# Patient Record
Sex: Female | Born: 1985 | Race: White | Hispanic: No | Marital: Married | Smoking: Never smoker
Health system: Southern US, Community
[De-identification: ages and names within clinical notes are randomized; demographics above are authoritative.]

## PROBLEM LIST (undated history)

## (undated) DIAGNOSIS — I10 Essential (primary) hypertension: Secondary | ICD-10-CM

## (undated) DIAGNOSIS — O24419 Gestational diabetes mellitus in pregnancy, unspecified control: Secondary | ICD-10-CM

## (undated) DIAGNOSIS — E079 Disorder of thyroid, unspecified: Secondary | ICD-10-CM

## (undated) DIAGNOSIS — E119 Type 2 diabetes mellitus without complications: Secondary | ICD-10-CM

## (undated) HISTORY — PX: GASTRIC BYPASS: SHX52

---

## 2006-03-01 ENCOUNTER — Ambulatory Visit (HOSPITAL_COMMUNITY): Admission: RE | Admit: 2006-03-01 | Discharge: 2006-03-01 | Payer: Self-pay | Admitting: Surgery

## 2006-03-08 ENCOUNTER — Encounter: Admission: RE | Admit: 2006-03-08 | Discharge: 2006-06-06 | Payer: Self-pay | Admitting: Surgery

## 2006-03-08 ENCOUNTER — Ambulatory Visit (HOSPITAL_COMMUNITY): Admission: RE | Admit: 2006-03-08 | Discharge: 2006-03-08 | Payer: Self-pay | Admitting: Surgery

## 2006-03-09 ENCOUNTER — Ambulatory Visit (HOSPITAL_COMMUNITY): Admission: RE | Admit: 2006-03-09 | Discharge: 2006-03-09 | Payer: Self-pay | Admitting: Surgery

## 2006-11-23 ENCOUNTER — Encounter: Admission: RE | Admit: 2006-11-23 | Discharge: 2007-02-21 | Payer: Self-pay | Admitting: Surgery

## 2006-12-06 ENCOUNTER — Ambulatory Visit (HOSPITAL_COMMUNITY): Admission: RE | Admit: 2006-12-06 | Discharge: 2006-12-07 | Payer: Self-pay | Admitting: *Deleted

## 2009-03-27 ENCOUNTER — Encounter
Admission: RE | Admit: 2009-03-27 | Discharge: 2010-04-01 | Payer: Self-pay | Source: Home / Self Care | Attending: Unknown Physician Specialty | Admitting: Unknown Physician Specialty

## 2010-03-27 ENCOUNTER — Ambulatory Visit (HOSPITAL_COMMUNITY)
Admission: RE | Admit: 2010-03-27 | Discharge: 2010-03-27 | Payer: Self-pay | Source: Home / Self Care | Attending: Unknown Physician Specialty | Admitting: Unknown Physician Specialty

## 2010-04-10 ENCOUNTER — Ambulatory Visit (HOSPITAL_COMMUNITY)
Admission: RE | Admit: 2010-04-10 | Discharge: 2010-04-10 | Payer: Self-pay | Source: Home / Self Care | Attending: Unknown Physician Specialty | Admitting: Unknown Physician Specialty

## 2010-04-12 ENCOUNTER — Other Ambulatory Visit (HOSPITAL_COMMUNITY): Payer: Self-pay | Admitting: *Deleted

## 2010-04-12 DIAGNOSIS — O10919 Unspecified pre-existing hypertension complicating pregnancy, unspecified trimester: Secondary | ICD-10-CM

## 2010-04-12 DIAGNOSIS — O24919 Unspecified diabetes mellitus in pregnancy, unspecified trimester: Secondary | ICD-10-CM

## 2010-05-22 ENCOUNTER — Other Ambulatory Visit (HOSPITAL_COMMUNITY): Payer: Self-pay | Admitting: *Deleted

## 2010-05-22 ENCOUNTER — Ambulatory Visit (HOSPITAL_COMMUNITY)
Admission: RE | Admit: 2010-05-22 | Discharge: 2010-05-22 | Disposition: A | Discharge status: Home / Self Care | Payer: BC Managed Care – PPO | Source: Ambulatory Visit | Attending: Family Medicine | Admitting: Family Medicine

## 2010-05-22 ENCOUNTER — Other Ambulatory Visit (HOSPITAL_COMMUNITY): Payer: Self-pay

## 2010-05-22 DIAGNOSIS — O10919 Unspecified pre-existing hypertension complicating pregnancy, unspecified trimester: Secondary | ICD-10-CM

## 2010-05-22 DIAGNOSIS — Z363 Encounter for antenatal screening for malformations: Secondary | ICD-10-CM | POA: Insufficient documentation

## 2010-05-22 DIAGNOSIS — Z1389 Encounter for screening for other disorder: Secondary | ICD-10-CM | POA: Insufficient documentation

## 2010-05-22 DIAGNOSIS — O358XX0 Maternal care for other (suspected) fetal abnormality and damage, not applicable or unspecified: Secondary | ICD-10-CM | POA: Insufficient documentation

## 2010-05-22 DIAGNOSIS — O24919 Unspecified diabetes mellitus in pregnancy, unspecified trimester: Secondary | ICD-10-CM

## 2010-05-22 DIAGNOSIS — O10019 Pre-existing essential hypertension complicating pregnancy, unspecified trimester: Secondary | ICD-10-CM | POA: Insufficient documentation

## 2010-06-19 ENCOUNTER — Other Ambulatory Visit (HOSPITAL_COMMUNITY): Payer: Self-pay | Admitting: Unknown Physician Specialty

## 2010-06-19 ENCOUNTER — Ambulatory Visit (HOSPITAL_COMMUNITY)
Admission: RE | Admit: 2010-06-19 | Discharge: 2010-06-19 | Disposition: A | Discharge status: Home / Self Care | Payer: BC Managed Care – PPO | Source: Ambulatory Visit | Attending: Unknown Physician Specialty | Admitting: Unknown Physician Specialty

## 2010-06-19 DIAGNOSIS — O10019 Pre-existing essential hypertension complicating pregnancy, unspecified trimester: Secondary | ICD-10-CM | POA: Insufficient documentation

## 2010-06-19 DIAGNOSIS — O24919 Unspecified diabetes mellitus in pregnancy, unspecified trimester: Secondary | ICD-10-CM

## 2010-06-19 DIAGNOSIS — O10919 Unspecified pre-existing hypertension complicating pregnancy, unspecified trimester: Secondary | ICD-10-CM

## 2010-07-17 ENCOUNTER — Ambulatory Visit (HOSPITAL_COMMUNITY)
Admission: RE | Admit: 2010-07-17 | Discharge: 2010-07-17 | Disposition: A | Discharge status: Home / Self Care | Payer: BC Managed Care – PPO | Source: Ambulatory Visit | Attending: Unknown Physician Specialty | Admitting: Unknown Physician Specialty

## 2010-07-17 ENCOUNTER — Encounter (HOSPITAL_COMMUNITY): Payer: Self-pay

## 2010-07-17 DIAGNOSIS — O24919 Unspecified diabetes mellitus in pregnancy, unspecified trimester: Secondary | ICD-10-CM | POA: Insufficient documentation

## 2010-07-17 DIAGNOSIS — O10019 Pre-existing essential hypertension complicating pregnancy, unspecified trimester: Secondary | ICD-10-CM | POA: Insufficient documentation

## 2010-08-05 NOTE — Op Note (Signed)
NAMEBRYLEIGH, OTTAWAY                ACCOUNT NO.:  [id number]   MEDICAL RECORD NO.:  [id number]          PATIENT TYPE:  AMB   LOCATION:  DAY                          FACILITY:  [facility]   PHYSICIAN:  Thornton Park. Daphine Deutscher, MD  DATE OF BIRTH:  Dec 15, 1985   DATE OF PROCEDURE:  12/06/2006  DATE OF DISCHARGE:                               OPERATIVE REPORT   CCS NUMBER:  [id number]   PREOPERATIVE DIAGNOSIS:  Morbid obesity, body mass index 47.   POSTOPERATIVE DIAGNOSIS:  Morbid obesity, body mass index 47.   PROCEDURE:  Placement of Allergan APL lap band.   SURGEON:  Thornton Park. Daphine Deutscher, MD   ASSISTANT:  Lorne Skeens. Hoxworth, MD   ANESTHESIA:  General.   DESCRIPTION OF PROCEDURE:  This 24 year old white female was taken to  room #1 on the afternoon of September 15 and given general anesthesia.  The abdomen was prepped with Techni-Care and draped sterilely.  The  abdomen was entered through the left upper quadrant with the auto-  applied medical handled OptiView-type device and then the standard ports  were placed without difficulty.  The patient had a very large liver and  had a really odd-looking caudate lobe.  Nevertheless, I was able to do a  dissection, identifying her anatomy.  She did have rather marked central  obesity.  I went ahead and passed a band passer through the upper  abdominal port and the 10/15 port was on the right side in the more  inferior position.  With the band passer in place, which went across  easily, I then inserted the APL system, then brought it around and  secured it.  Prior to doing this, I passed the tube for calibration and  then secured the band and snapped it down.  Three sutures were placed  using a Lapra-Ty after placing them with free sutures and getting good  apposition of the anterior fundus up onto the small pouch.  There was a  little bleeding with the last suture, but this stopped when the tie knot  was secured.  The tubing was brought out through the  15 port and then  this incision was then expanded and the port was connected and I had  allowed it to decompress naturally  and then when the pressure equilibrated, I put the new band port on and  then secured it with 4 sutures of 2-0 Prolene.  The wounds were  irrigated and closed 4-0 Vicryl and Dermabond.  The patient was taken to  the recovery room in satisfactory condition.      Thornton Park Daphine Deutscher, MD  Electronically Signed     MBM/MEDQ  D:  12/06/2006  T:  12/07/2006  Job:  [id number]   cc:   Donzetta Sprung  Fax: [phone]

## 2011-01-01 LAB — DIFFERENTIAL
Eosinophils Absolute: 0
Eosinophils Relative: 0
Lymphocytes Relative: 16
Lymphs Abs: 1.8
Monocytes Relative: 7

## 2011-01-01 LAB — CBC
HCT: 34 — ABNORMAL LOW
Hemoglobin: 11.6 — ABNORMAL LOW
MCV: 81.4
RBC: 4.17
WBC: 11.1 — ABNORMAL HIGH

## 2011-01-02 LAB — URINALYSIS, ROUTINE W REFLEX MICROSCOPIC
Glucose, UA: 250 — AB
Hgb urine dipstick: NEGATIVE
Ketones, ur: NEGATIVE
Protein, ur: NEGATIVE
pH: 5.5

## 2011-01-02 LAB — BASIC METABOLIC PANEL
CO2: 25
Calcium: 9.8
GFR calc Af Amer: >60
GFR calc non Af Amer: >60
Glucose, Bld: 150 — ABNORMAL HIGH
Potassium: 3.7
Sodium: 137

## 2011-01-02 LAB — URINE MICROSCOPIC-ADD ON

## 2011-01-02 LAB — HEMOGLOBIN AND HEMATOCRIT, BLOOD: HCT: 39.5

## 2013-09-11 ENCOUNTER — Other Ambulatory Visit: Payer: Self-pay

## 2013-09-29 ENCOUNTER — Encounter (HOSPITAL_COMMUNITY): Payer: Self-pay

## 2013-09-29 ENCOUNTER — Ambulatory Visit (HOSPITAL_COMMUNITY): Payer: PRIVATE HEALTH INSURANCE

## 2013-09-29 ENCOUNTER — Ambulatory Visit (HOSPITAL_COMMUNITY)
Admission: RE | Admit: 2013-09-29 | Discharge: 2013-09-29 | Disposition: A | Discharge status: Home / Self Care | Payer: PRIVATE HEALTH INSURANCE | Source: Ambulatory Visit | Attending: Nurse Practitioner | Admitting: Nurse Practitioner

## 2013-09-29 DIAGNOSIS — Z9884 Bariatric surgery status: Secondary | ICD-10-CM | POA: Insufficient documentation

## 2013-09-29 DIAGNOSIS — O10019 Pre-existing essential hypertension complicating pregnancy, unspecified trimester: Secondary | ICD-10-CM | POA: Insufficient documentation

## 2013-09-29 DIAGNOSIS — Z794 Long term (current) use of insulin: Secondary | ICD-10-CM | POA: Insufficient documentation

## 2013-09-29 DIAGNOSIS — E119 Type 2 diabetes mellitus without complications: Secondary | ICD-10-CM | POA: Insufficient documentation

## 2013-09-29 DIAGNOSIS — O24919 Unspecified diabetes mellitus in pregnancy, unspecified trimester: Secondary | ICD-10-CM | POA: Insufficient documentation

## 2013-09-29 HISTORY — DX: Essential (primary) hypertension: I10

## 2013-09-29 HISTORY — DX: Gestational diabetes mellitus in pregnancy, unspecified control: O24.419

## 2013-09-29 NOTE — Progress Notes (Signed)
MATERNAL FETAL MEDICINE CONSULT  Patient Name: Elizabeth Pennington Medical Record Number:  [id number] Date of Birth: 04-21-1985 Requesting Physician Name:  Orbie Hurst, NP Date of Service: 09/29/2013  Chief Complaint Type II diabetes in pregnancy  History of Present Illness Melondy Blanchard was seen today secondary to type II diabetes in pregnancy at the request of Orbie Hurst, NP.  The patient is a 28 y.o. G2P1,at [redacted]w[redacted]d with an EDD of 05/11/2014, by Last Menstrual Period dating method.  She has had type II diabetes mellitus since she was a sophomore in high school.  Prior to pregnancy she was receiving only metformin for glycemic control.  Since becoming pregnant, her primary OB Dr. Moishe Spice has placed her on Levemir insulin 50 units [place] qhs and Humalog insulin at a carb/insulin ration of 15 g:1 unit with each meal.  She also continues to take metformin extended release 1000 mg po daily.  Review of her glucose log reveals very poor glycemic control.  Her fastings range between 140-180.  Her one hour post-prandial values were consistently above 180 with several values over 200.  Review of Systems Pertinent items are noted in HPI.  Obstetrical History G1 LTCS at 38 weeks due to macrosomia G2  Current  Past Medical and Surgical History Ms. Kenan has type II diabetes mellitus and chronic hypertension.  In addition to her LTCS she also had laparoscopic gastric banding in 2007.  She has no other history of chronic medical diseases or prior surgeries.  Family History The patient and the father of the baby have no family history of mental retardation, birth defects, or genetic diseases.  Social History The patient does not use tobacco, alcohol, or other illicit drugs.   Assessment and Recommendations 1.  Very poorly controlled type II diabetes.  Given the severity of the hyperglycemia and that fact that Ms. Lorenzen is just about to enter the window of organogenesis, I recommended hospitalization for  aggressive increases in her insulin doses to achieve euglycemia as soon as possible to diminish the risk of birth defects.    Ms. Tarazon decline preferring to have her insulin doses increased as an outpatient.  The values reviewed at today's visit were prior to the most recent increase in the dose of Levemir and the inititation of Humalog with meals.  Thus I will not make any adjustments to her regimen at this time.  Ms. Robleto will fax [place] her glucose log next Tuesday to determine if adjustments are necessary.  We will also be happy to continue to monitor her blood sugars and make adjustments throughout pregnancy.  I reviewed the goals of therapy, fasting blood sugars less than 90 mg/dl and 2-hour postprandial less than 120 mg/dl with avoidance of hypoglycemia.  Ms. Primm should have interval serial growth ultrasounds every 4 weeks after her anatomy scan at approximately 18 weeks and twice weekly antepartum testing beginning at 32 weeks. 2.  Chronic Hypertension.  Although Ms. Sapp reports her blood pressure has been well-controlled since she stopped her BP medication 3 months ago when trying to conceive, her chronic hypertension still poses a risk to her pregnancy including an increased risk of fetal growth restriction, preterm birth, and superimposed preeclampsia.  As she also has DM she does not require any further ultrasounds or fetal monitoring other than that listed above. 3.  History of gastric banding.  I do not suspect this will be a problem as it did not cause any issues in her last pregnancy.  If the  balloon is not already deflated, I would arrange for it to be so.  I spent 30 minutes with Ms. Berens today of which 50% was face-to-face counseling.  [facility].  Please do not hesitate to contact us with questions.   Rema FendtNITSCHE,Kyndel Egger, MD

## 2013-10-03 ENCOUNTER — Other Ambulatory Visit: Payer: Self-pay

## 2013-10-03 ENCOUNTER — Telehealth (HOSPITAL_COMMUNITY): Payer: Self-pay | Admitting: *Deleted

## 2013-10-03 NOTE — Telephone Encounter (Signed)
[facility] faxed in blood sugar log today, see scanned document.  Rev'd sugars with Dr. Vincenza HewsQuinn. Notified pt to increase Levemir to 55units at night and Humulog 6units at breakfast, 9units at lunch and dinner.  Pt instructed to take humulog right before meals.  Pt voices understanding.

## 2013-11-03 ENCOUNTER — Other Ambulatory Visit: Payer: Self-pay

## 2013-12-12 ENCOUNTER — Encounter (HOSPITAL_COMMUNITY): Payer: Self-pay | Admitting: Obstetrics and Gynecology

## 2013-12-14 ENCOUNTER — Other Ambulatory Visit (HOSPITAL_COMMUNITY): Payer: Self-pay | Admitting: Obstetrics and Gynecology

## 2013-12-14 DIAGNOSIS — Z3689 Encounter for other specified antenatal screening: Secondary | ICD-10-CM

## 2013-12-14 DIAGNOSIS — O24912 Unspecified diabetes mellitus in pregnancy, second trimester: Secondary | ICD-10-CM

## 2013-12-20 ENCOUNTER — Encounter (HOSPITAL_COMMUNITY): Payer: Self-pay

## 2013-12-20 ENCOUNTER — Ambulatory Visit (HOSPITAL_COMMUNITY)
Admission: RE | Admit: 2013-12-20 | Discharge: 2013-12-20 | Disposition: A | Discharge status: Home / Self Care | Payer: PRIVATE HEALTH INSURANCE | Source: Ambulatory Visit | Attending: Obstetrics and Gynecology | Admitting: Obstetrics and Gynecology

## 2013-12-20 ENCOUNTER — Other Ambulatory Visit (HOSPITAL_COMMUNITY): Payer: PRIVATE HEALTH INSURANCE

## 2013-12-20 VITALS — BP 125/64 | HR 78 | Wt 260.8 lb

## 2013-12-20 DIAGNOSIS — O24912 Unspecified diabetes mellitus in pregnancy, second trimester: Secondary | ICD-10-CM

## 2013-12-20 DIAGNOSIS — E119 Type 2 diabetes mellitus without complications: Secondary | ICD-10-CM | POA: Insufficient documentation

## 2013-12-20 DIAGNOSIS — Z3689 Encounter for other specified antenatal screening: Secondary | ICD-10-CM | POA: Diagnosis not present

## 2013-12-20 DIAGNOSIS — O24919 Unspecified diabetes mellitus in pregnancy, unspecified trimester: Secondary | ICD-10-CM | POA: Insufficient documentation

## 2014-01-11 ENCOUNTER — Encounter: Payer: Self-pay | Admitting: Obstetrics and Gynecology

## 2014-01-17 ENCOUNTER — Ambulatory Visit (HOSPITAL_COMMUNITY)
Admission: RE | Admit: 2014-01-17 | Discharge: 2014-01-17 | Disposition: A | Discharge status: Home / Self Care | Payer: PRIVATE HEALTH INSURANCE | Source: Ambulatory Visit | Attending: Obstetrics and Gynecology | Admitting: Obstetrics and Gynecology

## 2014-01-17 ENCOUNTER — Ambulatory Visit (HOSPITAL_COMMUNITY): Payer: PRIVATE HEALTH INSURANCE

## 2014-01-17 ENCOUNTER — Other Ambulatory Visit (HOSPITAL_COMMUNITY): Payer: Self-pay | Admitting: Maternal and Fetal Medicine

## 2014-01-17 ENCOUNTER — Encounter (HOSPITAL_COMMUNITY): Payer: Self-pay

## 2014-01-17 VITALS — BP 121/58 | HR 83 | Wt 262.2 lb

## 2014-01-17 DIAGNOSIS — O24112 Pre-existing diabetes mellitus, type 2, in pregnancy, second trimester: Secondary | ICD-10-CM | POA: Insufficient documentation

## 2014-01-17 DIAGNOSIS — Z36 Encounter for antenatal screening of mother: Secondary | ICD-10-CM | POA: Insufficient documentation

## 2014-01-17 DIAGNOSIS — O34219 Maternal care for unspecified type scar from previous cesarean delivery: Secondary | ICD-10-CM

## 2014-01-17 DIAGNOSIS — Z9884 Bariatric surgery status: Secondary | ICD-10-CM

## 2014-01-17 DIAGNOSIS — Z3A23 23 weeks gestation of pregnancy: Secondary | ICD-10-CM | POA: Diagnosis not present

## 2014-01-17 DIAGNOSIS — O162 Unspecified maternal hypertension, second trimester: Secondary | ICD-10-CM

## 2014-01-17 DIAGNOSIS — Z3689 Encounter for other specified antenatal screening: Secondary | ICD-10-CM

## 2014-01-17 DIAGNOSIS — O24912 Unspecified diabetes mellitus in pregnancy, second trimester: Secondary | ICD-10-CM

## 2014-01-17 DIAGNOSIS — E119 Type 2 diabetes mellitus without complications: Secondary | ICD-10-CM | POA: Insufficient documentation

## 2014-01-17 DIAGNOSIS — O10012 Pre-existing essential hypertension complicating pregnancy, second trimester: Secondary | ICD-10-CM

## 2014-01-17 DIAGNOSIS — O24913 Unspecified diabetes mellitus in pregnancy, third trimester: Secondary | ICD-10-CM

## 2014-01-22 ENCOUNTER — Encounter (HOSPITAL_COMMUNITY): Payer: Self-pay

## 2014-02-14 ENCOUNTER — Ambulatory Visit (HOSPITAL_COMMUNITY): Payer: PRIVATE HEALTH INSURANCE

## 2014-02-14 ENCOUNTER — Encounter (HOSPITAL_COMMUNITY): Payer: Self-pay

## 2014-02-14 ENCOUNTER — Ambulatory Visit (HOSPITAL_COMMUNITY)
Admission: RE | Admit: 2014-02-14 | Discharge: 2014-02-14 | Disposition: A | Discharge status: Home / Self Care | Payer: PRIVATE HEALTH INSURANCE | Source: Ambulatory Visit | Attending: Obstetrics and Gynecology | Admitting: Obstetrics and Gynecology

## 2014-02-14 DIAGNOSIS — O24112 Pre-existing diabetes mellitus, type 2, in pregnancy, second trimester: Secondary | ICD-10-CM | POA: Insufficient documentation

## 2014-02-14 DIAGNOSIS — O352XX0 Maternal care for (suspected) hereditary disease in fetus, not applicable or unspecified: Secondary | ICD-10-CM | POA: Insufficient documentation

## 2014-02-14 DIAGNOSIS — Z9884 Bariatric surgery status: Secondary | ICD-10-CM

## 2014-02-14 DIAGNOSIS — O337 Maternal care for disproportion due to other fetal deformities: Secondary | ICD-10-CM | POA: Diagnosis not present

## 2014-02-14 DIAGNOSIS — O10012 Pre-existing essential hypertension complicating pregnancy, second trimester: Secondary | ICD-10-CM | POA: Insufficient documentation

## 2014-02-14 DIAGNOSIS — Z3A27 27 weeks gestation of pregnancy: Secondary | ICD-10-CM | POA: Diagnosis not present

## 2014-02-14 DIAGNOSIS — O24912 Unspecified diabetes mellitus in pregnancy, second trimester: Secondary | ICD-10-CM

## 2014-02-14 DIAGNOSIS — O99212 Obesity complicating pregnancy, second trimester: Secondary | ICD-10-CM | POA: Diagnosis present

## 2014-02-14 DIAGNOSIS — O3421 Maternal care for scar from previous cesarean delivery: Secondary | ICD-10-CM | POA: Diagnosis not present

## 2014-02-14 DIAGNOSIS — Z36 Encounter for antenatal screening of mother: Secondary | ICD-10-CM | POA: Diagnosis not present

## 2014-02-14 DIAGNOSIS — O24913 Unspecified diabetes mellitus in pregnancy, third trimester: Secondary | ICD-10-CM

## 2014-02-14 DIAGNOSIS — O34219 Maternal care for unspecified type scar from previous cesarean delivery: Secondary | ICD-10-CM

## 2014-02-14 DIAGNOSIS — Z794 Long term (current) use of insulin: Secondary | ICD-10-CM | POA: Diagnosis not present

## 2014-02-14 DIAGNOSIS — Z3689 Encounter for other specified antenatal screening: Secondary | ICD-10-CM

## 2014-02-14 DIAGNOSIS — O24919 Unspecified diabetes mellitus in pregnancy, unspecified trimester: Secondary | ICD-10-CM | POA: Insufficient documentation

## 2014-03-14 ENCOUNTER — Encounter (HOSPITAL_COMMUNITY): Payer: Self-pay

## 2014-03-14 ENCOUNTER — Ambulatory Visit (HOSPITAL_COMMUNITY)
Admission: RE | Admit: 2014-03-14 | Discharge: 2014-03-14 | Disposition: A | Discharge status: Home / Self Care | Payer: PRIVATE HEALTH INSURANCE | Source: Ambulatory Visit | Attending: Obstetrics and Gynecology | Admitting: Obstetrics and Gynecology

## 2014-03-14 ENCOUNTER — Other Ambulatory Visit (HOSPITAL_COMMUNITY): Payer: Self-pay | Admitting: Maternal and Fetal Medicine

## 2014-03-14 DIAGNOSIS — O99213 Obesity complicating pregnancy, third trimester: Secondary | ICD-10-CM | POA: Insufficient documentation

## 2014-03-14 DIAGNOSIS — Z3A27 27 weeks gestation of pregnancy: Secondary | ICD-10-CM | POA: Diagnosis not present

## 2014-03-14 DIAGNOSIS — Z3A31 31 weeks gestation of pregnancy: Secondary | ICD-10-CM | POA: Insufficient documentation

## 2014-03-14 DIAGNOSIS — O24913 Unspecified diabetes mellitus in pregnancy, third trimester: Secondary | ICD-10-CM

## 2014-03-14 DIAGNOSIS — O10012 Pre-existing essential hypertension complicating pregnancy, second trimester: Secondary | ICD-10-CM | POA: Insufficient documentation

## 2014-04-11 ENCOUNTER — Ambulatory Visit (HOSPITAL_COMMUNITY): Payer: PRIVATE HEALTH INSURANCE

## 2014-04-12 ENCOUNTER — Encounter (HOSPITAL_COMMUNITY): Payer: Self-pay

## 2014-04-12 ENCOUNTER — Ambulatory Visit (HOSPITAL_COMMUNITY)
Admission: RE | Admit: 2014-04-12 | Discharge: 2014-04-12 | Disposition: A | Discharge status: Home / Self Care | Payer: PRIVATE HEALTH INSURANCE | Source: Ambulatory Visit | Attending: Obstetrics and Gynecology | Admitting: Obstetrics and Gynecology

## 2014-04-12 DIAGNOSIS — O99210 Obesity complicating pregnancy, unspecified trimester: Secondary | ICD-10-CM | POA: Insufficient documentation

## 2014-04-12 DIAGNOSIS — O24113 Pre-existing diabetes mellitus, type 2, in pregnancy, third trimester: Secondary | ICD-10-CM | POA: Diagnosis not present

## 2014-04-12 DIAGNOSIS — Z3A35 35 weeks gestation of pregnancy: Secondary | ICD-10-CM | POA: Diagnosis not present

## 2014-04-12 DIAGNOSIS — O24919 Unspecified diabetes mellitus in pregnancy, unspecified trimester: Secondary | ICD-10-CM | POA: Insufficient documentation

## 2014-04-12 DIAGNOSIS — O10013 Pre-existing essential hypertension complicating pregnancy, third trimester: Secondary | ICD-10-CM | POA: Diagnosis not present

## 2014-04-12 DIAGNOSIS — O34219 Maternal care for unspecified type scar from previous cesarean delivery: Secondary | ICD-10-CM | POA: Insufficient documentation

## 2014-04-12 DIAGNOSIS — O24913 Unspecified diabetes mellitus in pregnancy, third trimester: Secondary | ICD-10-CM

## 2014-04-12 DIAGNOSIS — E119 Type 2 diabetes mellitus without complications: Secondary | ICD-10-CM | POA: Insufficient documentation

## 2014-04-12 DIAGNOSIS — O99843 Bariatric surgery status complicating pregnancy, third trimester: Secondary | ICD-10-CM | POA: Diagnosis not present

## 2014-04-12 DIAGNOSIS — O3421 Maternal care for scar from previous cesarean delivery: Secondary | ICD-10-CM | POA: Diagnosis not present

## 2014-08-04 ENCOUNTER — Encounter (HOSPITAL_COMMUNITY): Payer: Self-pay | Admitting: *Deleted

## 2016-09-14 IMAGING — US US OB FOLLOW-UP
1 series · 12 of 28 positions shown · non-contrast
Comparison: none

[Series 1: us ob follow-up · 0.23mm/px · 33 acquisitions, 12 frames shown]
[im 2/33]
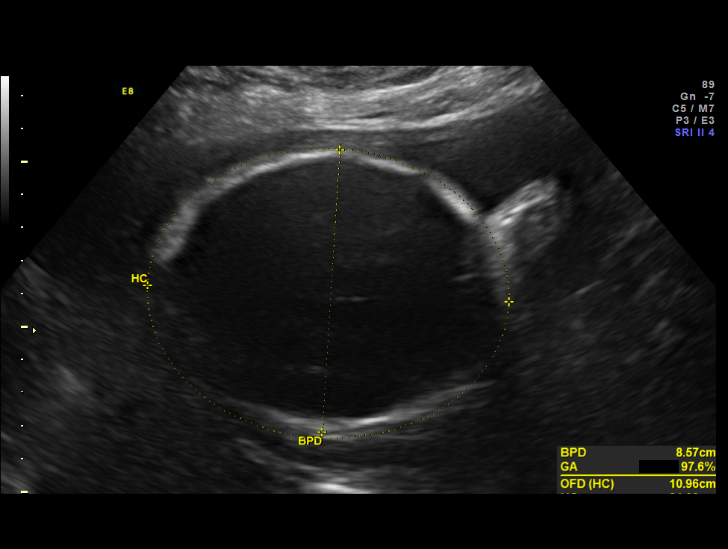
[im 4/33]
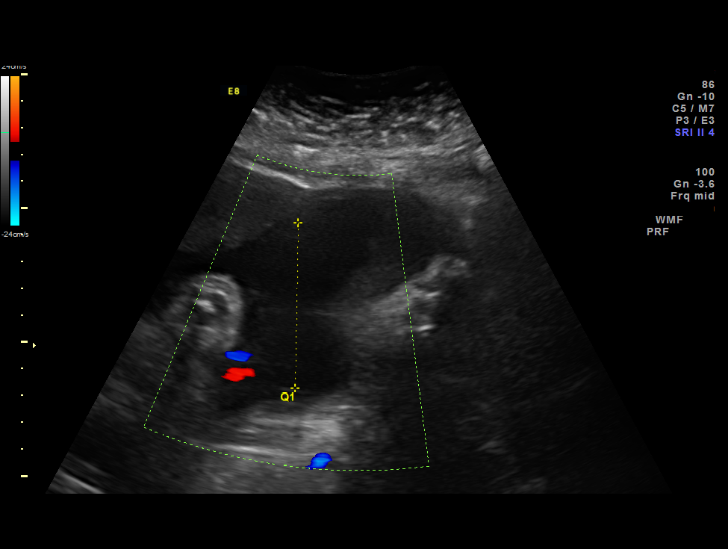
[im 6/33]
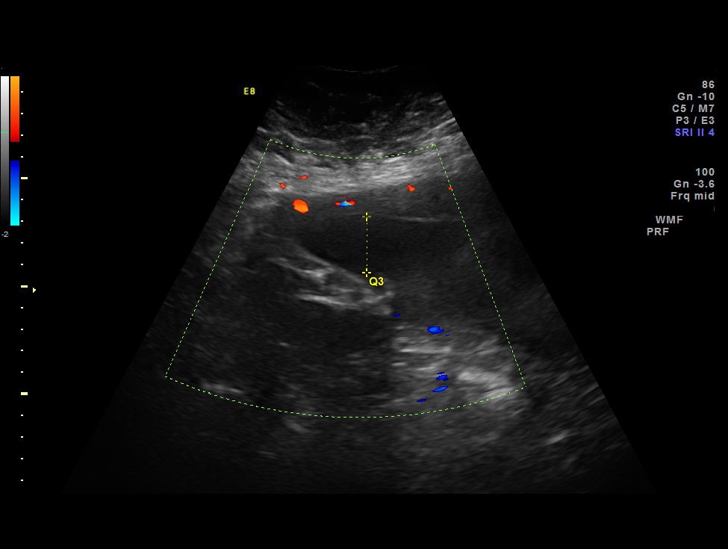
[im 10/33]
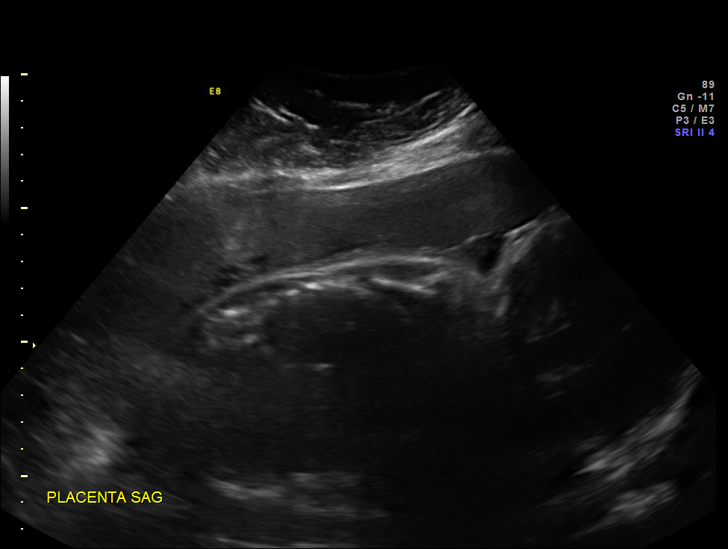
[im 12/33]
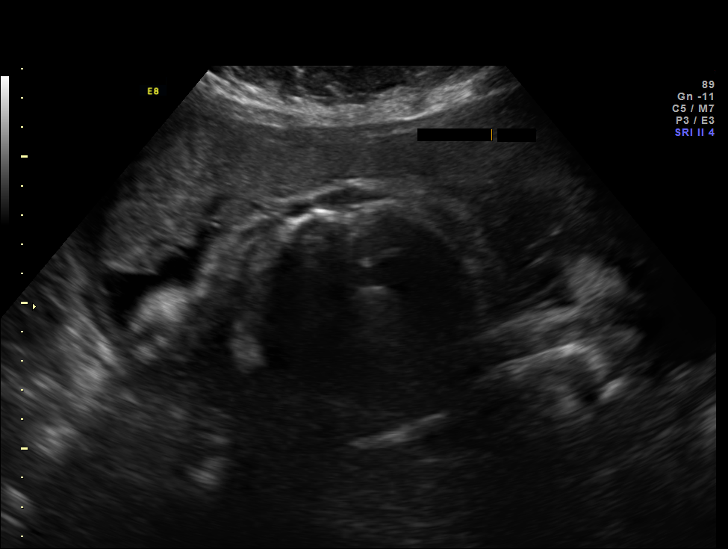
[im 15/33]
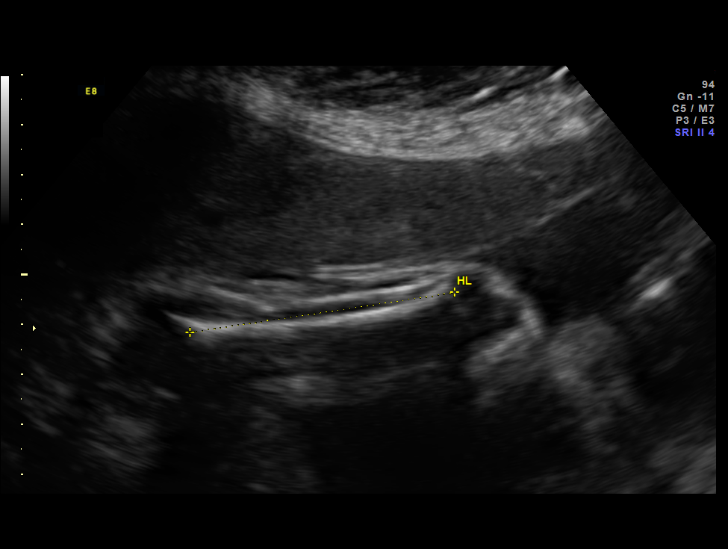
[im 18/33]
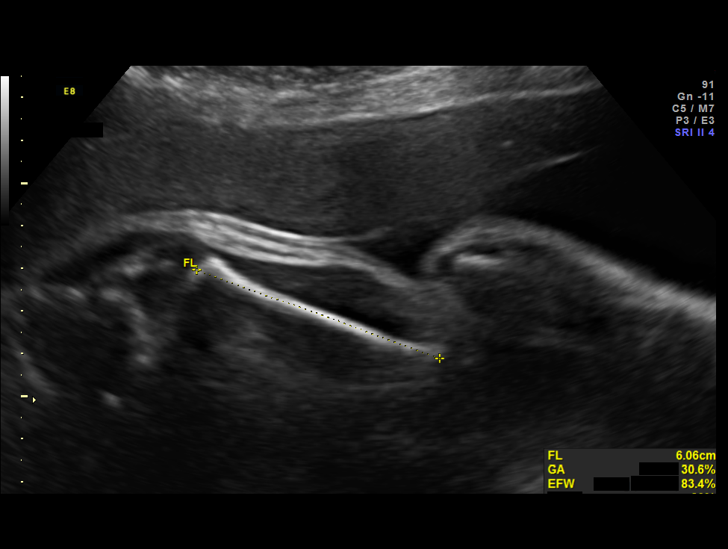
[im 21/33]
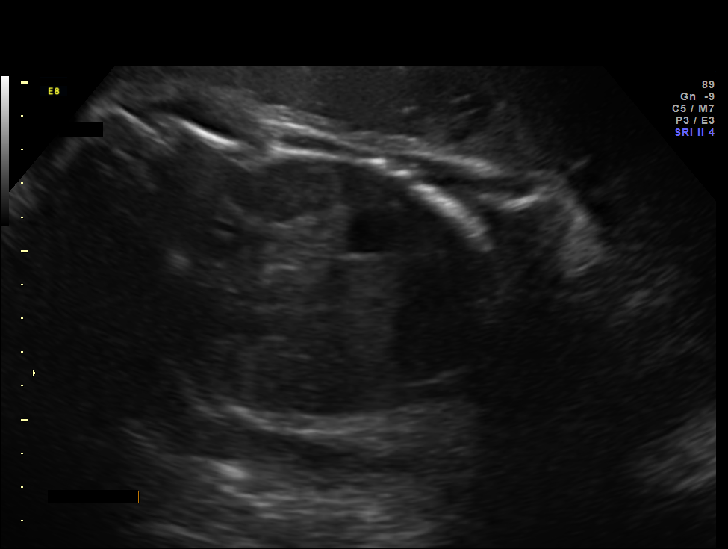
[im 23/33]
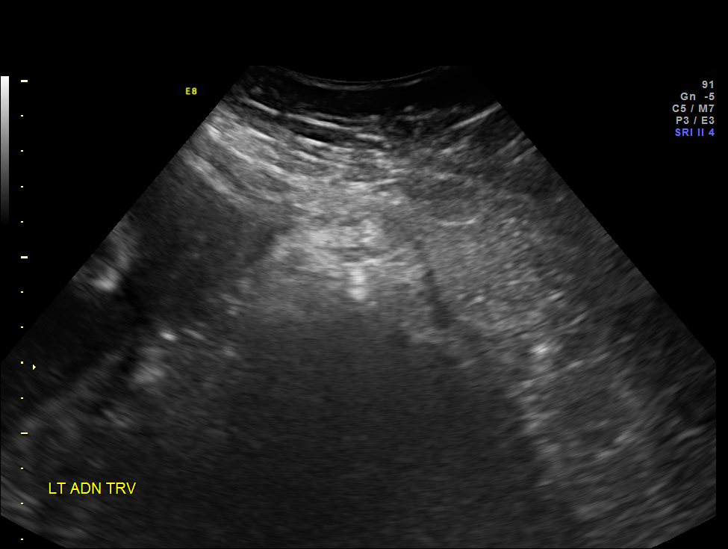
[im 27/33]
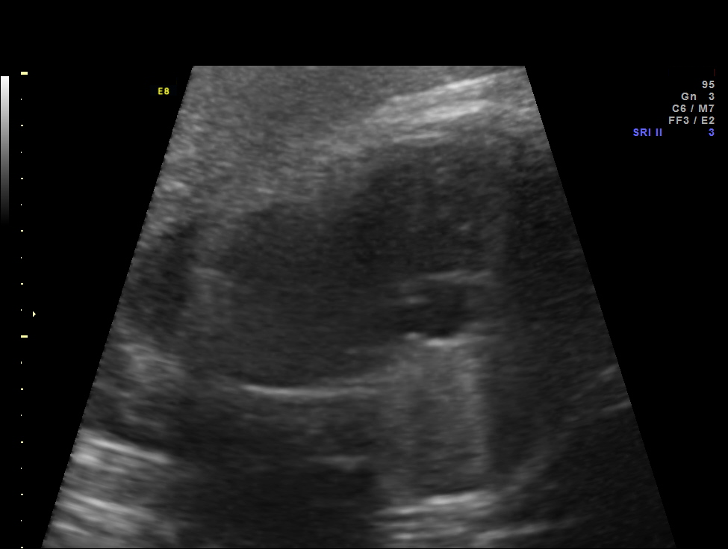
[im 29/33]
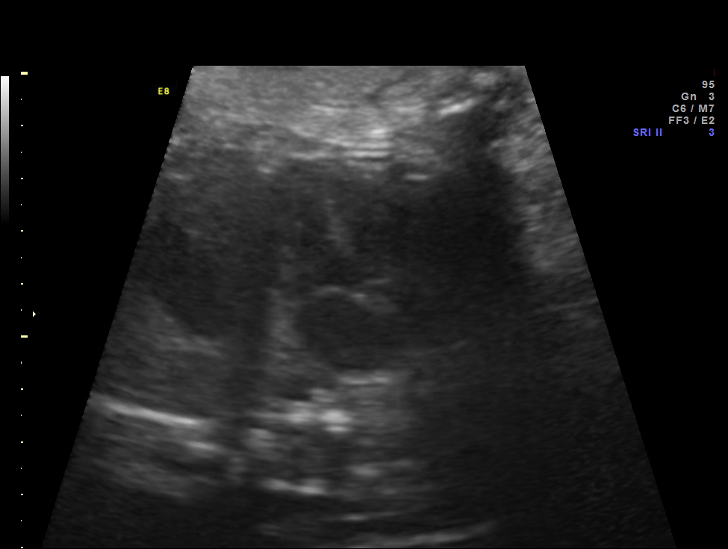
[im 31/33]
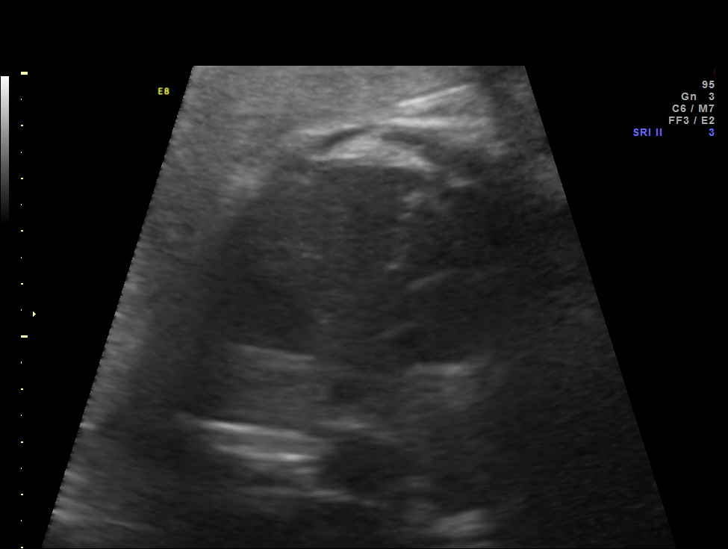

[12 of 28 positions shown; findings below may reference images not displayed]

OBSTETRICS REPORT
                      (Signed Final 03/14/2014 [DATE])

Service(s) Provided

 US OB FOLLOW UP                                       76816.1
Indications

 H/O Gastric bypass
 History of cesarean delivery, currently pregnant
 Poor obstetric history: Previous macrosomia

 31 weeks gestation of pregnancy
 Obesity complicating pregnancy, third trimester
 (273 lb)
 Pre-existing diabetes, type 2, in pregnancy, third
 trimester (Levemir, Humalog and Metformin)
 Normal fetal ECHO
 Pre-existing essential hypertension complicating
 pregnancy, third trimester
Fetal Evaluation

 Num Of Fetuses:    1
 Fetal Heart Rate:  138                          bpm
 Cardiac Activity:  Observed
 Presentation:      Cephalic
 Placenta:          Anterior, above cervical os
 P. Cord            Previously Visualized
 Insertion:

 Amniotic Fluid
 AFI FV:      Subjectively within normal limits
 AFI Sum:     16.13   cm       58  %Tile     Larg Pckt:    6.19  cm
 RUQ:   6.19    cm   RLQ:    2.5    cm    LUQ:   4.85    cm   LLQ:    2.59   cm
Biometry

 BPD:     85.2  mm     G. Age:  34w 2d                CI:         79.5   70 - 86
 OFD:    107.2  mm                                    FL/HC:      20.1   19.1 -

 HC:     307.6  mm     G. Age:  34w 2d       81  %    HC/AC:      1.00   0.96 -

 AC:     308.2  mm     G. Age:  34w 5d     > 97  %    FL/BPD:     72.5   71 - 87
 FL:      61.8  mm     G. Age:  32w 0d       46  %    FL/AC:      20.1   20 - 24
 HUM:     54.5  mm     G. Age:  31w 5d       51  %

 Est. FW:    2155  gm      5 lb 1 oz     85  %
Gestational Age

 LMP:           31w 5d        Date:  08/04/13                 EDD:   05/11/14
 U/S Today:     33w 6d                                        EDD:   04/26/14
 Best:          31w 5d     Det. By:  LMP  (08/04/13)          EDD:   05/11/14
Anatomy

 Cranium:          Appears normal         Aortic Arch:      Not well visualized
 Fetal Cavum:      Previously seen        Ductal Arch:      Not well visualized
 Ventricles:       Previously seen        Diaphragm:        Appears normal
 Choroid Plexus:   Previously seen        Stomach:          Appears normal, left
                                                            sided
 Cerebellum:       Previously seen        Abdomen:          Appears normal
 Posterior Fossa:  Previously seen        Abdominal Wall:   Previously seen
 Nuchal Fold:      Previously seen        Cord Vessels:     Previously seen
 Face:             Orbits and profile     Kidneys:          Appear normal
                   previously seen
 Lips:             Previously seen        Bladder:          Appears normal
 Heart:            Appears normal         Spine:            Previously seen
                   (4CH, axis, and
                   situs)
 RVOT:             Not well visualized    Lower             Previously seen
                                          Extremities:
 LVOT:             Appears normal         Upper             Previously seen
                                          Extremities:

 Other:  Female gender previously seen. Technically difficult due to maternal
         habitus and fetal position. Markedly suboptimal exam.
Cervix Uterus Adnexa

 Cervix:       Not visualized (advanced GA >09wks)

 Adnexa:     No abnormality visualized.
Impression

 SIUP at 31+5 weeks
 Normal interval anatomy; anatomic survey complete; limited
 views of RVOT and arches
 Normal amniotic fluid volume
 EFW at the 85th %tile; AC > 97th %tile
Recommendations

 Agree with testing starting next week
 Follow-up ultrasound for growth in 4 weeks

 questions or concerns.

## 2017-04-21 DIAGNOSIS — E119 Type 2 diabetes mellitus without complications: Secondary | ICD-10-CM | POA: Diagnosis not present

## 2017-04-21 DIAGNOSIS — E782 Mixed hyperlipidemia: Secondary | ICD-10-CM | POA: Diagnosis not present

## 2017-04-21 DIAGNOSIS — J020 Streptococcal pharyngitis: Secondary | ICD-10-CM | POA: Diagnosis not present

## 2017-04-21 DIAGNOSIS — I10 Essential (primary) hypertension: Secondary | ICD-10-CM | POA: Diagnosis not present

## 2017-09-28 DIAGNOSIS — E119 Type 2 diabetes mellitus without complications: Secondary | ICD-10-CM | POA: Diagnosis not present

## 2017-09-28 DIAGNOSIS — I10 Essential (primary) hypertension: Secondary | ICD-10-CM | POA: Diagnosis not present

## 2017-09-28 DIAGNOSIS — F411 Generalized anxiety disorder: Secondary | ICD-10-CM | POA: Diagnosis not present

## 2017-09-28 DIAGNOSIS — Z6841 Body Mass Index (BMI) 40.0 and over, adult: Secondary | ICD-10-CM | POA: Diagnosis not present

## 2017-09-28 DIAGNOSIS — E782 Mixed hyperlipidemia: Secondary | ICD-10-CM | POA: Diagnosis not present

## 2017-12-24 DIAGNOSIS — E782 Mixed hyperlipidemia: Secondary | ICD-10-CM | POA: Diagnosis not present

## 2017-12-24 DIAGNOSIS — E1169 Type 2 diabetes mellitus with other specified complication: Secondary | ICD-10-CM | POA: Diagnosis not present

## 2017-12-24 DIAGNOSIS — E1165 Type 2 diabetes mellitus with hyperglycemia: Secondary | ICD-10-CM | POA: Diagnosis not present

## 2017-12-24 DIAGNOSIS — I10 Essential (primary) hypertension: Secondary | ICD-10-CM | POA: Diagnosis not present

## 2017-12-24 DIAGNOSIS — E7800 Pure hypercholesterolemia, unspecified: Secondary | ICD-10-CM | POA: Diagnosis not present

## 2017-12-28 DIAGNOSIS — F411 Generalized anxiety disorder: Secondary | ICD-10-CM | POA: Diagnosis not present

## 2017-12-28 DIAGNOSIS — I10 Essential (primary) hypertension: Secondary | ICD-10-CM | POA: Diagnosis not present

## 2017-12-28 DIAGNOSIS — E119 Type 2 diabetes mellitus without complications: Secondary | ICD-10-CM | POA: Diagnosis not present

## 2017-12-28 DIAGNOSIS — E782 Mixed hyperlipidemia: Secondary | ICD-10-CM | POA: Diagnosis not present

## 2018-01-05 DIAGNOSIS — Z6838 Body mass index (BMI) 38.0-38.9, adult: Secondary | ICD-10-CM | POA: Diagnosis not present

## 2018-01-05 DIAGNOSIS — B349 Viral infection, unspecified: Secondary | ICD-10-CM | POA: Diagnosis not present

## 2018-01-05 DIAGNOSIS — R509 Fever, unspecified: Secondary | ICD-10-CM | POA: Diagnosis not present

## 2018-01-17 DIAGNOSIS — Z01419 Encounter for gynecological examination (general) (routine) without abnormal findings: Secondary | ICD-10-CM | POA: Diagnosis not present

## 2018-01-17 DIAGNOSIS — Z975 Presence of (intrauterine) contraceptive device: Secondary | ICD-10-CM | POA: Diagnosis not present

## 2018-01-17 DIAGNOSIS — Z6841 Body Mass Index (BMI) 40.0 and over, adult: Secondary | ICD-10-CM | POA: Diagnosis not present

## 2018-07-05 DIAGNOSIS — E782 Mixed hyperlipidemia: Secondary | ICD-10-CM | POA: Diagnosis not present

## 2018-07-05 DIAGNOSIS — I10 Essential (primary) hypertension: Secondary | ICD-10-CM | POA: Diagnosis not present

## 2018-07-05 DIAGNOSIS — E1165 Type 2 diabetes mellitus with hyperglycemia: Secondary | ICD-10-CM | POA: Diagnosis not present

## 2018-07-12 DIAGNOSIS — E119 Type 2 diabetes mellitus without complications: Secondary | ICD-10-CM | POA: Diagnosis not present

## 2018-07-12 DIAGNOSIS — I10 Essential (primary) hypertension: Secondary | ICD-10-CM | POA: Diagnosis not present

## 2018-07-12 DIAGNOSIS — E782 Mixed hyperlipidemia: Secondary | ICD-10-CM | POA: Diagnosis not present

## 2018-07-20 DIAGNOSIS — Z20828 Contact with and (suspected) exposure to other viral communicable diseases: Secondary | ICD-10-CM | POA: Diagnosis not present

## 2018-07-29 DIAGNOSIS — Z6838 Body mass index (BMI) 38.0-38.9, adult: Secondary | ICD-10-CM | POA: Diagnosis not present

## 2018-07-29 DIAGNOSIS — F419 Anxiety disorder, unspecified: Secondary | ICD-10-CM | POA: Diagnosis not present

## 2018-08-12 DIAGNOSIS — F419 Anxiety disorder, unspecified: Secondary | ICD-10-CM | POA: Diagnosis not present

## 2018-08-12 DIAGNOSIS — Z6838 Body mass index (BMI) 38.0-38.9, adult: Secondary | ICD-10-CM | POA: Diagnosis not present

## 2018-11-16 DIAGNOSIS — L03031 Cellulitis of right toe: Secondary | ICD-10-CM | POA: Diagnosis not present

## 2018-11-16 DIAGNOSIS — Z6838 Body mass index (BMI) 38.0-38.9, adult: Secondary | ICD-10-CM | POA: Diagnosis not present

## 2019-01-06 DIAGNOSIS — E782 Mixed hyperlipidemia: Secondary | ICD-10-CM | POA: Diagnosis not present

## 2019-01-06 DIAGNOSIS — I10 Essential (primary) hypertension: Secondary | ICD-10-CM | POA: Diagnosis not present

## 2019-01-06 DIAGNOSIS — Z6838 Body mass index (BMI) 38.0-38.9, adult: Secondary | ICD-10-CM | POA: Diagnosis not present

## 2019-01-06 DIAGNOSIS — E119 Type 2 diabetes mellitus without complications: Secondary | ICD-10-CM | POA: Diagnosis not present

## 2019-01-06 DIAGNOSIS — E039 Hypothyroidism, unspecified: Secondary | ICD-10-CM | POA: Diagnosis not present

## 2019-01-12 LAB — TSH: TSH: 0.35 — AB (ref 0.41–5.90)

## 2019-02-10 DIAGNOSIS — H52223 Regular astigmatism, bilateral: Secondary | ICD-10-CM | POA: Diagnosis not present

## 2019-03-02 DIAGNOSIS — E0590 Thyrotoxicosis, unspecified without thyrotoxic crisis or storm: Secondary | ICD-10-CM | POA: Diagnosis not present

## 2019-03-03 LAB — TSH: TSH: 0.24 — AB (ref 0.41–5.90)

## 2019-03-10 DIAGNOSIS — E0590 Thyrotoxicosis, unspecified without thyrotoxic crisis or storm: Secondary | ICD-10-CM | POA: Diagnosis not present

## 2019-04-04 DIAGNOSIS — E7800 Pure hypercholesterolemia, unspecified: Secondary | ICD-10-CM | POA: Diagnosis not present

## 2019-04-04 DIAGNOSIS — E1165 Type 2 diabetes mellitus with hyperglycemia: Secondary | ICD-10-CM | POA: Diagnosis not present

## 2019-04-04 DIAGNOSIS — E1169 Type 2 diabetes mellitus with other specified complication: Secondary | ICD-10-CM | POA: Diagnosis not present

## 2019-04-04 DIAGNOSIS — E782 Mixed hyperlipidemia: Secondary | ICD-10-CM | POA: Diagnosis not present

## 2019-06-16 DIAGNOSIS — Z23 Encounter for immunization: Secondary | ICD-10-CM | POA: Diagnosis not present

## 2019-06-19 DIAGNOSIS — Z01419 Encounter for gynecological examination (general) (routine) without abnormal findings: Secondary | ICD-10-CM | POA: Diagnosis not present

## 2019-06-19 DIAGNOSIS — Z975 Presence of (intrauterine) contraceptive device: Secondary | ICD-10-CM | POA: Diagnosis not present

## 2019-06-19 DIAGNOSIS — I10 Essential (primary) hypertension: Secondary | ICD-10-CM | POA: Diagnosis not present

## 2019-06-19 DIAGNOSIS — Z9884 Bariatric surgery status: Secondary | ICD-10-CM | POA: Diagnosis not present

## 2019-06-21 DIAGNOSIS — Z30430 Encounter for insertion of intrauterine contraceptive device: Secondary | ICD-10-CM | POA: Diagnosis not present

## 2019-07-05 DIAGNOSIS — M79675 Pain in left toe(s): Secondary | ICD-10-CM | POA: Diagnosis not present

## 2019-07-05 DIAGNOSIS — S9030XA Contusion of unspecified foot, initial encounter: Secondary | ICD-10-CM | POA: Diagnosis not present

## 2019-07-05 DIAGNOSIS — M79672 Pain in left foot: Secondary | ICD-10-CM | POA: Diagnosis not present

## 2019-07-19 DIAGNOSIS — Z30433 Encounter for removal and reinsertion of intrauterine contraceptive device: Secondary | ICD-10-CM | POA: Diagnosis not present

## 2019-08-23 DIAGNOSIS — S92335A Nondisplaced fracture of third metatarsal bone, left foot, initial encounter for closed fracture: Secondary | ICD-10-CM | POA: Diagnosis not present

## 2019-08-23 DIAGNOSIS — M79672 Pain in left foot: Secondary | ICD-10-CM | POA: Diagnosis not present

## 2019-08-23 DIAGNOSIS — M79675 Pain in left toe(s): Secondary | ICD-10-CM | POA: Diagnosis not present

## 2019-08-25 DIAGNOSIS — E0590 Thyrotoxicosis, unspecified without thyrotoxic crisis or storm: Secondary | ICD-10-CM | POA: Diagnosis not present

## 2019-08-25 DIAGNOSIS — E782 Mixed hyperlipidemia: Secondary | ICD-10-CM | POA: Diagnosis not present

## 2019-08-25 DIAGNOSIS — I10 Essential (primary) hypertension: Secondary | ICD-10-CM | POA: Diagnosis not present

## 2019-08-25 DIAGNOSIS — E1165 Type 2 diabetes mellitus with hyperglycemia: Secondary | ICD-10-CM | POA: Diagnosis not present

## 2019-08-26 LAB — TSH: TSH: 0.44 (ref 0.41–5.90)

## 2019-10-16 DIAGNOSIS — R600 Localized edema: Secondary | ICD-10-CM | POA: Diagnosis not present

## 2019-10-16 DIAGNOSIS — M79604 Pain in right leg: Secondary | ICD-10-CM | POA: Diagnosis not present

## 2019-10-16 DIAGNOSIS — M79661 Pain in right lower leg: Secondary | ICD-10-CM | POA: Diagnosis not present

## 2019-12-22 DIAGNOSIS — E782 Mixed hyperlipidemia: Secondary | ICD-10-CM | POA: Diagnosis not present

## 2019-12-22 DIAGNOSIS — E1165 Type 2 diabetes mellitus with hyperglycemia: Secondary | ICD-10-CM | POA: Diagnosis not present

## 2019-12-22 DIAGNOSIS — I10 Essential (primary) hypertension: Secondary | ICD-10-CM | POA: Diagnosis not present

## 2019-12-22 DIAGNOSIS — E0590 Thyrotoxicosis, unspecified without thyrotoxic crisis or storm: Secondary | ICD-10-CM | POA: Diagnosis not present

## 2019-12-22 DIAGNOSIS — E7800 Pure hypercholesterolemia, unspecified: Secondary | ICD-10-CM | POA: Diagnosis not present

## 2019-12-23 LAB — TSH: TSH: 0.32 — AB (ref 0.41–5.90)

## 2019-12-27 DIAGNOSIS — E782 Mixed hyperlipidemia: Secondary | ICD-10-CM | POA: Diagnosis not present

## 2019-12-27 DIAGNOSIS — Z6838 Body mass index (BMI) 38.0-38.9, adult: Secondary | ICD-10-CM | POA: Diagnosis not present

## 2019-12-27 DIAGNOSIS — I10 Essential (primary) hypertension: Secondary | ICD-10-CM | POA: Diagnosis not present

## 2019-12-27 DIAGNOSIS — E119 Type 2 diabetes mellitus without complications: Secondary | ICD-10-CM | POA: Diagnosis not present

## 2020-01-04 ENCOUNTER — Other Ambulatory Visit: Payer: Self-pay

## 2020-01-04 ENCOUNTER — Encounter: Payer: Self-pay | Admitting: Nurse Practitioner

## 2020-01-04 ENCOUNTER — Ambulatory Visit (INDEPENDENT_AMBULATORY_CARE_PROVIDER_SITE_OTHER): Payer: BC Managed Care – PPO | Admitting: Nurse Practitioner

## 2020-01-04 VITALS — BP 113/74 | HR 84 | Ht 65.0 in | Wt 244.0 lb

## 2020-01-04 DIAGNOSIS — E0590 Thyrotoxicosis, unspecified without thyrotoxic crisis or storm: Secondary | ICD-10-CM

## 2020-01-04 NOTE — Progress Notes (Addendum)
01/04/2020     Endocrinology Consult Note    Subjective:    Patient ID: Elizabeth Pennington, female    DOB: 10/25/1985, PCP Elizabeth Mullet, PA-C.   Past Medical History:  Diagnosis Date  . Gestational diabetes   . Hypertension     Past Surgical History:  Procedure Laterality Date  . CESAREAN SECTION    . GASTRIC BYPASS      Social History   Socioeconomic History  . Marital status: Married    Spouse name: Not on file  . Number of children: Not on file  . Years of education: Not on file  . Highest education level: Not on file  Occupational History  . Not on file  Tobacco Use  . Smoking status: Never Smoker  . Smokeless tobacco: Never Used  Substance and Sexual Activity  . Alcohol use: No  . Drug use: No  . Sexual activity: Not on file  Other Topics Concern  . Not on file  Social History Narrative  . Not on file   Social Determinants of Health   Financial Resource Strain:   . Difficulty of Paying Living Expenses: Not on file  Food Insecurity:   . Worried About Charity fundraiser in the Last Year: Not on file  . Ran Out of Food in the Last Year: Not on file  Transportation Needs:   . Lack of Transportation (Medical): Not on file  . Lack of Transportation (Non-Medical): Not on file  Physical Activity:   . Days of Exercise per Week: Not on file  . Minutes of Exercise per Session: Not on file  Stress:   . Feeling of Stress : Not on file  Social Connections:   . Frequency of Communication with Friends and Family: Not on file  . Frequency of Social Gatherings with Friends and Family: Not on file  . Attends Religious Services: Not on file  . Active Member of Clubs or Organizations: Not on file  . Attends Archivist Meetings: Not on file  . Marital Status: Not on file    History reviewed. No pertinent family history.  Outpatient Encounter Medications as of 01/04/2020  Medication Sig  . amLODipine (NORVASC) 10 MG tablet Take 10 mg by mouth  daily.  Marland Kitchen aspirin 81 MG EC tablet Take by mouth.  Marland Kitchen JARDIANCE 25 MG TABS tablet Take 25 mg by mouth daily.  Marland Kitchen lisinopril (ZESTRIL) 10 MG tablet Take 10 mg by mouth daily.  . metFORMIN (GLUCOPHAGE-XR) 500 MG 24 hr tablet Take 1,000 mg by mouth 2 (two) times daily.  . rosuvastatin (CRESTOR) 5 MG tablet Take 5 mg by mouth daily.  . TRULICITY 1.5 FT/7.3UK SOPN SMARTSIG:0.5 Milliliter(s) SUB-Q Once a Week  . [DISCONTINUED] AMLODIPINE BESYLATE PO Take by mouth.  . [DISCONTINUED] insulin detemir (LEVEMIR) 100 UNIT/ML injection Inject into the skin at bedtime.  . [DISCONTINUED] insulin lispro (HUMALOG) 100 UNIT/ML injection Inject into the skin 3 (three) times daily before meals.  . [DISCONTINUED] METFORMIN HCL ER PO Take by mouth. (Patient not taking: Reported on 01/04/2020)  . [DISCONTINUED] Prenatal Vit w/Fe-Methylfol-FA (PNV PO) Take by mouth. (Patient not taking: Reported on 01/04/2020)   No facility-administered encounter medications on file as of 01/04/2020.    ALLERGIES: No Known Allergies  VACCINATION STATUS:  There is no immunization history on file for this patient.   HPI  Elizabeth Pennington is 34 y.o. female who presents today with a medical history as above. she is being  seen in consultation for hyperthyroidism requested by Elizabeth Mullet, PA-C.  she has been dealing with symptoms of hair loss, palpitations, anxiety, and irritability for the last year. These symptoms are progressively worsening and troubling to her. her most recent thyroid labs revealed marginally suppressed TSH of 0.317 on 12/26/19.  she denies dysphagia, choking, shortness of breath, no recent voice change.    she denies family history of thyroid dysfunction, but endorses a  family hx of thyroid cancer (unknown type) in her mother. she denies personal history of goiter. she is not on any anti-thyroid medications nor on any thyroid hormone supplements.  She does take a Women's MVI containing Biotin.  she is willing to proceed  with appropriate work up and therapy for thyrotoxicosis.  She was found to have suppressed TSH on routine labs with her PCP and states her TSH has been declining over the last year which has prompted the referral to our office. She has had an uptake and scan on 03/09/2019 at [facility] which was normal (no report available for review).  Review of systems  Constitutional: + weight loss, + fatigue, no subjective hyperthermia Eyes: no blurry vision, no xerophthalmia ENT: no sore throat, no nodules palpated in throat, no dysphagia/odynophagia, nor hoarseness Cardiovascular: no Chest Pain, no Shortness of Breath, mild intermittent palpitations, no leg swelling Respiratory: no cough, no SOB Gastrointestinal: no Nausea, no Vomiting, no Diarrhea Musculoskeletal: no muscle/joint aches Skin: no rashes Neurological: mild intermittent tremors, no numbness, no tingling, no dizziness Psychiatric: no depression, + anxiety, + irritability   Objective:    BP 113/74 (BP Location: Right Arm, Patient Position: Sitting)   Pulse 84   Ht 5' 5"  (1.651 m)   Wt 244 lb (110.7 kg)   BMI 40.60 kg/m   Wt Readings from Last 3 Encounters:  01/04/20 244 lb (110.7 kg)  04/12/14 281 lb (127.5 kg)  03/14/14 273 lb 8 oz (124.1 kg)      BP Readings from Last 3 Encounters:  01/04/20 113/74  04/12/14 120/76  03/14/14 117/73                       Physical exam  Constitutional: Body mass index is 40.6 kg/m., not in acute distress, normal state of mind Eyes: PERRLA, EOMI, no exophthalmos ENT: moist mucous membranes, no thyromegaly, no cervical lymphadenopathy Cardiovascular: normal precordial activity, RRR,  no Murmur/Rubs/Gallops Respiratory:  adequate breathing efforts, no gross chest deformity, Clear to auscultation bilaterally Gastrointestinal: abdomen soft, Non -tender, No distension, Bowel Sounds present Musculoskeletal: no gross deformities, strength intact in all four extremities Skin: moist, warm,  no rashes Neurological: no tremor with outstretched hands,  normal Deep Tendon Reflexes on both lower extremities.   CMP     Component Value Date/Time   NA 137 12/01/2006 1356   K 3.7 12/01/2006 1356   CL 100 12/01/2006 1356   CO2 25 12/01/2006 1356   GLUCOSE 150 (H) 12/01/2006 1356   BUN 7 12/01/2006 1356   CREATININE 0.49 12/01/2006 1356   CALCIUM 9.8 12/01/2006 1356   GFRNONAA >60 12/01/2006 1356   GFRAA  12/01/2006 1356    >60        The eGFR has been calculated using the MDRD equation. This calculation has not been validated in all clinical     CBC    Component Value Date/Time   WBC 11.1 (H) 12/07/2006 0440   RBC 4.17 12/07/2006 0440   HGB 11.6 (L) 12/07/2006 0440  HCT 34.0 (L) 12/07/2006 0440   PLT 306 12/07/2006 0440   MCV 81.4 12/07/2006 0440   MCHC 34.2 12/07/2006 0440   RDW 12.5 12/07/2006 0440   LYMPHSABS 1.8 12/07/2006 0440   MONOABS 0.7 12/07/2006 0440   EOSABS 0.0 12/07/2006 0440   BASOSABS 0.0 12/07/2006 0440     Diabetic Labs (most recent): No results found for: HGBA1C  Lipid Panel  No results found for: CHOL, TRIG, HDL, CHOLHDL, VLDL, LDLCALC, LDLDIRECT, LABVLDL   Lab Results  Component Value Date   TSH 0.32 (A) 12/26/2019   TSH 0.44 08/25/2019   TSH 0.24 (A) 03/02/2019   TSH 0.35 (A) 01/06/2019    Uptake and Scan on 03/09/2019  Findings: Homogeneous tracer distribution in both thyroid lobes.  No focal areas of increased or decreased tracer localization seen. 4-hour I-123 uptake = 7% (normal 5-20%) 24-hour I-123 uptake = 16% (normal 10-30%)  Impression: Normal thyroid scan.  Normal 4-hour and 24-hour radio iodine uptakes as above    Assessment & Plan:   1. Subclinical hyperthyroidism - TSH - T4, free - T3, free - Thyroid peroxidase antibody - Thyroglobulin antibody - US THYROID  she is being seen at a kind request of Luciana Axe, Amy H, PA-C.  her history and most recent labs are reviewed, and she was examined clinically.  Subjective and objective findings are consistent with subclinical hyperthyroidism. The potential risks of untreated thyrotoxicosis and the need for definitive therapy have been discussed in detail with her, and she agrees to proceed with diagnostic workup and treatment plan.   I like to repeat full profile thyroid function tests once she has stopped taking her Biotin for at least 2 weeks.  I will also order a baseline thyroid ultrasound to assess thyroid anatomy.   Options of therapy are discussed with her.  We discussed the option of treating it with medications including methimazole or PTU which may have side effects including rash, transaminitis, and bone marrow suppression.  We  also discussed the option of definitive therapy with RAI ablation of the thyroid.   If  she is found to have primary hyperthyroidism from Graves' disease , toxic multinodular goiter or toxic nodular goiter the preferred modality of treatment would be I-131 thyroid ablation.    -Patient is made aware of the high likelihood of post ablative hypothyroidism with subsequent need for lifelong thyroid hormone replacement. sheunderstands this outcome  and she is  willing to proceed.  Although surgery is one other choice of treatment in some cases, in her case surgery is not a good fit for presentation with only mild goiter.    she will return in approximately 6 weeks for treatment decision.  I did not initiate any new prescriptions at today's visit.  -Patient is advised to maintain close follow up with Elizabeth Mullet, PA-C for primary care needs.   - Time spent with the patient: 60 minutes, of which >50% was spent in obtaining information about her symptoms, reviewing her previous labs, evaluations, and treatments, counseling her about her subclinical hyperthyroidism , and developing a plan to confirm the diagnosis and long term treatment as necessary. Please refer to " Patient Self Inventory" in the Media  tab for reviewed elements  of pertinent patient history.  Elizabeth Pennington participated in the discussions, expressed understanding, and voiced agreement with the above plans.  All questions were answered to her satisfaction. she is encouraged to contact clinic should she have any questions or concerns prior to her return visit.  Follow up plan: Return in about 6 weeks (around 02/15/2020) for Thyroid follow up, Previsit labs, thyroid ultrasound.   Thank you for involving me in the care of this pleasant patient, and I will continue to update you with her progress.  Rayetta Pigg, [facility] [address] [place], [place] [address] Phone: [phone] Fax: [phone]  01/04/2020, 3:28 PM  This note was partially dictated with voice recognition software. Similar sounding words can be transcribed inadequately or may not  be corrected upon review.

## 2020-01-15 ENCOUNTER — Ambulatory Visit (HOSPITAL_COMMUNITY)
Admission: RE | Admit: 2020-01-15 | Discharge: 2020-01-15 | Disposition: A | Discharge status: Home / Self Care | Payer: BC Managed Care – PPO | Source: Ambulatory Visit | Attending: Nurse Practitioner | Admitting: Nurse Practitioner

## 2020-01-15 ENCOUNTER — Other Ambulatory Visit: Payer: Self-pay

## 2020-01-15 DIAGNOSIS — E0590 Thyrotoxicosis, unspecified without thyrotoxic crisis or storm: Secondary | ICD-10-CM | POA: Diagnosis not present

## 2020-01-15 DIAGNOSIS — E042 Nontoxic multinodular goiter: Secondary | ICD-10-CM | POA: Diagnosis not present

## 2020-01-18 ENCOUNTER — Telehealth: Payer: Self-pay | Admitting: "Endocrinology

## 2020-01-18 NOTE — Telephone Encounter (Signed)
Please advise 

## 2020-01-18 NOTE — Telephone Encounter (Signed)
Pt states she had a ultrasound done and would like to know if there was any concerning findings.

## 2020-01-19 NOTE — Telephone Encounter (Signed)
Returned call to patient and advised, verbalized understanding, no further questions or concerns at this time.

## 2020-01-19 NOTE — Telephone Encounter (Signed)
Her ultrasound showed multiple nodules on her thyroid, none of which meet criteria for additional intervention or dedicated follow up.  It is something we will just keep an eye on in the future, nothing concerning.

## 2020-02-21 ENCOUNTER — Ambulatory Visit: Payer: BC Managed Care – PPO | Admitting: Nurse Practitioner

## 2020-03-21 DIAGNOSIS — E1165 Type 2 diabetes mellitus with hyperglycemia: Secondary | ICD-10-CM | POA: Diagnosis not present

## 2020-03-21 DIAGNOSIS — E7800 Pure hypercholesterolemia, unspecified: Secondary | ICD-10-CM | POA: Diagnosis not present

## 2020-03-21 DIAGNOSIS — E782 Mixed hyperlipidemia: Secondary | ICD-10-CM | POA: Diagnosis not present

## 2020-03-21 DIAGNOSIS — E1169 Type 2 diabetes mellitus with other specified complication: Secondary | ICD-10-CM | POA: Diagnosis not present

## 2020-03-21 LAB — HEPATIC FUNCTION PANEL
ALT: 43 — AB (ref 7–35)
AST: 37 — AB (ref 13–35)
Alkaline Phosphatase: 49 (ref 25–125)
Bilirubin, Total: 0.3

## 2020-03-21 LAB — BASIC METABOLIC PANEL
BUN: 6 (ref 4–21)
CO2: 23 — AB (ref 13–22)
Chloride: 100 (ref 99–108)
Creatinine: 0.3 — AB (ref 0.5–1.1)
Glucose: 184
Potassium: 4.2 (ref 3.4–5.3)
Sodium: 138 (ref 137–147)

## 2020-03-21 LAB — LIPID PANEL
Cholesterol: 121 (ref 0–200)
HDL: 46 (ref 35–70)
LDL Cholesterol: 53
Triglycerides: 120 (ref 40–160)

## 2020-03-21 LAB — COMPREHENSIVE METABOLIC PANEL
Albumin: 4.1 (ref 3.5–5.0)
Calcium: 9.4 (ref 8.7–10.7)
GFR calc Af Amer: 165
GFR calc non Af Amer: 143
Globulin: 2.5

## 2020-03-21 LAB — HEMOGLOBIN A1C: Hemoglobin A1C: 8.6

## 2020-03-21 LAB — TSH: TSH: 0.38 — AB (ref 0.41–5.90)

## 2020-03-27 ENCOUNTER — Ambulatory Visit: Payer: BC Managed Care – PPO | Admitting: Nurse Practitioner

## 2020-03-29 ENCOUNTER — Ambulatory Visit: Payer: BC Managed Care – PPO | Admitting: Nurse Practitioner

## 2020-04-03 ENCOUNTER — Ambulatory Visit: Payer: BC Managed Care – PPO | Admitting: Nurse Practitioner

## 2020-04-03 ENCOUNTER — Encounter: Payer: Self-pay | Admitting: Nurse Practitioner

## 2020-04-03 ENCOUNTER — Telehealth: Payer: Self-pay

## 2020-04-03 ENCOUNTER — Other Ambulatory Visit: Payer: Self-pay

## 2020-04-03 ENCOUNTER — Ambulatory Visit (INDEPENDENT_AMBULATORY_CARE_PROVIDER_SITE_OTHER): Payer: BC Managed Care – PPO | Admitting: Nurse Practitioner

## 2020-04-03 VITALS — BP 121/82 | HR 81 | Ht 65.0 in | Wt 243.8 lb

## 2020-04-03 DIAGNOSIS — E0590 Thyrotoxicosis, unspecified without thyrotoxic crisis or storm: Secondary | ICD-10-CM

## 2020-04-03 NOTE — Progress Notes (Signed)
04/03/2020     Endocrinology Follow Up Visit    Subjective:    Patient ID: Elizabeth Pennington, female    DOB: Aug 03, 1985, PCP Encarnacion Slates, PA-C.   Past Medical History:  Diagnosis Date  . Gestational diabetes   . Hypertension     Past Surgical History:  Procedure Laterality Date  . CESAREAN SECTION    . GASTRIC BYPASS      Social History   Socioeconomic History  . Marital status: Married    Spouse name: Not on file  . Number of children: Not on file  . Years of education: Not on file  . Highest education level: Not on file  Occupational History  . Not on file  Tobacco Use  . Smoking status: Never Smoker  . Smokeless tobacco: Never Used  Substance and Sexual Activity  . Alcohol use: No  . Drug use: No  . Sexual activity: Not on file  Other Topics Concern  . Not on file  Social History Narrative  . Not on file   Social Determinants of Health   Financial Resource Strain: Not on file  Food Insecurity: Not on file  Transportation Needs: Not on file  Physical Activity: Not on file  Stress: Not on file  Social Connections: Not on file    History reviewed. No pertinent family history.  Outpatient Encounter Medications as of 04/03/2020  Medication Sig  . amLODipine (NORVASC) 10 MG tablet Take 10 mg by mouth daily.  Marland Kitchen aspirin 81 MG EC tablet Take by mouth.  Marland Kitchen JARDIANCE 25 MG TABS tablet Take 25 mg by mouth daily.  Marland Kitchen lisinopril (ZESTRIL) 10 MG tablet Take 10 mg by mouth daily.  . metFORMIN (GLUCOPHAGE-XR) 500 MG 24 hr tablet Take 1,000 mg by mouth 2 (two) times daily.  . rosuvastatin (CRESTOR) 5 MG tablet Take 5 mg by mouth daily.  . TRULICITY 1.5 MG/0.5ML SOPN SMARTSIG:0.5 Milliliter(s) SUB-Q Once a Week   No facility-administered encounter medications on file as of 04/03/2020.    ALLERGIES: No Known Allergies  VACCINATION STATUS:  There is no immunization history on file for this patient.   HPI  Elizabeth Pennington is 35 y.o. female who presents today  with a medical history as above. she is being seen in follow up after being seen in consultation for hyperthyroidism requested by Encarnacion Slates, PA-C.  she has been dealing with symptoms of hair loss, palpitations, anxiety, and irritability for the last year. These symptoms are progressively worsening and troubling to her. her most recent thyroid labs revealed marginally suppressed TSH of 0.317 on 12/26/19.  she denies dysphagia, choking, shortness of breath, no recent voice change.    she denies family history of thyroid dysfunction, but endorses a  family hx of thyroid cancer (unknown type) in her mother. she denies personal history of goiter. she is not on any anti-thyroid medications nor on any thyroid hormone supplements.  She has stopped taking her Women's MVI containing Biotin since last visit.   She was found to have suppressed TSH on routine labs with her PCP and states her TSH has been declining over the last year which has prompted the referral to our office. She has had an uptake and scan on 03/09/2019 at [facility] which was normal (no report available for review).  Review of systems  Constitutional: + weight loss, + fatigue, no subjective hyperthermia Eyes: no blurry vision, no xerophthalmia ENT: no sore throat, no nodules palpated in throat, no  dysphagia/odynophagia, nor hoarseness, intermittent feeling of fullness in throat Cardiovascular: no Chest Pain, no Shortness of Breath, no palpitations, no leg swelling Respiratory: no cough, no SOB Gastrointestinal: no Nausea, intermittent Vomiting (secondary to meal intake and lap band surgery), no Diarrhea Musculoskeletal: no muscle/joint aches Skin: no rashes Neurological: no tremors, no numbness, no tingling, no dizziness Psychiatric: no depression, + anxiety, + irritability (continued from last visit)   Objective:    BP 121/82 (BP Location: Left Arm, Patient Position: Sitting)   Pulse 81   Ht 5\' 5"  (1.651 m)   Wt 243 lb 12.8  oz (110.6 kg)   BMI 40.57 kg/m   Wt Readings from Last 3 Encounters:  04/03/20 243 lb 12.8 oz (110.6 kg)  01/04/20 244 lb (110.7 kg)  04/12/14 281 lb (127.5 kg)      BP Readings from Last 3 Encounters:  04/03/20 121/82  01/04/20 113/74  04/12/14 120/76                       Physical exam  Constitutional: Body mass index is 40.57 kg/m., not in acute distress, normal state of mind Eyes: PERRLA, EOMI, no exophthalmos ENT: moist mucous membranes, no thyromegaly, no cervical lymphadenopathy Cardiovascular: normal precordial activity, RRR,  no Murmur/Rubs/Gallops Respiratory:  adequate breathing efforts, no gross chest deformity, Clear to auscultation bilaterally Gastrointestinal: abdomen soft, Non -tender, No distension, Bowel Sounds present Musculoskeletal: no gross deformities, strength intact in all four extremities Skin: moist, warm, no rashes Neurological: no tremor with outstretched hands,  normal Deep Tendon Reflexes on both lower extremities.   CMP     Component Value Date/Time   NA 138 03/21/2020 0000   K 4.2 03/21/2020 0000   CL 100 03/21/2020 0000   CO2 23 (A) 03/21/2020 0000   GLUCOSE 150 (H) 12/01/2006 1356   BUN 6 03/21/2020 0000   CREATININE 0.3 (A) 03/21/2020 0000   CREATININE 0.49 12/01/2006 1356   CALCIUM 9.4 03/21/2020 0000   ALBUMIN 4.1 03/21/2020 0000   AST 37 (A) 03/21/2020 0000   ALT 43 (A) 03/21/2020 0000   ALKPHOS 49 03/21/2020 0000   GFRNONAA 143 03/21/2020 0000   GFRAA 165 03/21/2020 0000     CBC    Component Value Date/Time   WBC 11.1 (H) 12/07/2006 0440   RBC 4.17 12/07/2006 0440   HGB 11.6 (L) 12/07/2006 0440   HCT 34.0 (L) 12/07/2006 0440   PLT 306 12/07/2006 0440   MCV 81.4 12/07/2006 0440   MCHC 34.2 12/07/2006 0440   RDW 12.5 12/07/2006 0440   LYMPHSABS 1.8 12/07/2006 0440   MONOABS 0.7 12/07/2006 0440   EOSABS 0.0 12/07/2006 0440   BASOSABS 0.0 12/07/2006 0440     Diabetic Labs (most recent): Lab Results  Component  Value Date   HGBA1C 8.6 03/21/2020    Lipid Panel     Component Value Date/Time   CHOL 121 03/21/2020 0000   TRIG 120 03/21/2020 0000   HDL 46 03/21/2020 0000   LDLCALC 53 03/21/2020 0000     Lab Results  Component Value Date   TSH 0.38 (A) 03/21/2020   TSH 0.32 (A) 12/22/2019   TSH 0.44 08/25/2019   TSH 0.24 (A) 03/02/2019   TSH 0.35 (A) 01/06/2019    Uptake and Scan on 03/09/2019  Findings: Homogeneous tracer distribution in both thyroid lobes.  No focal areas of increased or decreased tracer localization seen. 4-hour I-123 uptake = 7% (normal 5-20%) 24-hour I-123 uptake = 16% (normal  10-30%)  Impression: Normal thyroid scan.  Normal 4-hour and 24-hour radio iodine uptakes as above ---------------------------------------------------------------------------------------------------------------------------- Thyroid Ultrasound from 01/15/20 CLINICAL DATA:  Neck fullness, subclinical hyperthyroidism  EXAM: THYROID ULTRASOUND  TECHNIQUE: Ultrasound examination of the thyroid gland and adjacent soft tissues was performed.  COMPARISON:  None.  FINDINGS: Parenchymal Echotexture: Mildly heterogenous  Isthmus: 9 mm  Right lobe: 4.7 x 1.7 x 1.5 cm  Left lobe: 4.8 x 1.5 x 1.8 cm  _________________________________________________________  Estimated total number of nodules >/= 1 cm: 1  Number of spongiform nodules >/=  2 cm not described below (TR1): 0  Number of mixed cystic and solid nodules >/= 1.5 cm not described below (TR2): 0  _________________________________________________________  Nodule # 3:  Location: Right; Inferior  Maximum size: 1.3 cm; Other 2 dimensions: 0.6 x 0.9 cm  Composition: mixed cystic and solid (1)  Echogenicity: isoechoic (1)  Shape: not taller-than-wide (0)  Margins: smooth (0)  Echogenic foci: none (0)  ACR TI-RADS total points: 2.  ACR TI-RADS risk category: TR2 (2 points).  ACR TI-RADS  recommendations:  This nodule does NOT meet TI-RADS criteria for biopsy or dedicated follow-up.  _________________________________________________________  There are scattered additional subcentimeter nodules noted which are predominately isoechoic and partially cystic. These all measuring 9 mm or less in size and would not meet criteria for any biopsy or follow-up and are not fully described by TI rads criteria.  Nonspecific thyroid heterogeneity. No hypervascularity. No regional adenopathy.  IMPRESSION: 1.3 cm right inferior TR 2 nodule does not meet criteria for biopsy or follow-up.  Additional subcentimeter nodules noted.  No significant finding that warrants biopsy or follow-up.  The above is in keeping with the ACR TI-RADS recommendations - J Am Coll Radiol 2017;14:587-595.   Electronically Signed   By: Judie PetitM.  Shick M.D.   On: 01/15/2020 16:21  Assessment & Plan:   1. Subclinical hyperthyroidism she is being seen at a kind request of Leavy CellaBoyd, Amy H, PA-C.  Her repeat thyroid labs (off the biotin) show slight improvement in TSH to 0.376 and normal T3 uptake, T4, and Free thyroxine index.  Her thyroid antibody tests were negative, ruling out autoimmune Graves or Hashimoto's.    Her thyroid ultrasound showed multiple nodules, none of which were concerning enough for dedicated follow up or biopsy.    The recommendation at this time is to monitor her thyroid labs in 3 months for continued improvement.  Will monitor TSH, Free T3, and Free T4 levels prior to next visit.  I did not initiate any new prescriptions at today's visit.  -Patient is advised to maintain close follow up with Encarnacion SlatesBoyd, Amy H, PA-C for primary care needs.      - Time spent on this patient care encounter:  20 minutes of which 50% was spent in  counseling and the rest reviewing  her current and  previous labs / studies and medications  doses and developing a plan for long term care. Erasmo Downererra Kerin   participated in the discussions, expressed understanding, and voiced agreement with the above plans.  All questions were answered to her satisfaction. she is encouraged to contact clinic should she have any questions or concerns prior to her return visit.  Follow up plan: Return in about 3 months (around 07/02/2020) for Thyroid follow up, Previsit labs.   Thank you for involving me in the care of this pleasant patient, and I will continue to update you with her progress.  Ronny BaconWhitney Selmer Adduci, FNP-BC [facility]  [address] [place], [place] [address] Phone: [phone] Fax: [phone]  04/03/2020, 3:59 PM

## 2020-05-22 NOTE — Telephone Encounter (Signed)
error 

## 2020-07-02 ENCOUNTER — Ambulatory Visit: Payer: BC Managed Care – PPO | Admitting: Nurse Practitioner

## 2020-10-03 DIAGNOSIS — E1165 Type 2 diabetes mellitus with hyperglycemia: Secondary | ICD-10-CM | POA: Diagnosis not present

## 2020-10-03 DIAGNOSIS — E7800 Pure hypercholesterolemia, unspecified: Secondary | ICD-10-CM | POA: Diagnosis not present

## 2020-10-03 DIAGNOSIS — E119 Type 2 diabetes mellitus without complications: Secondary | ICD-10-CM | POA: Diagnosis not present

## 2020-10-03 DIAGNOSIS — E782 Mixed hyperlipidemia: Secondary | ICD-10-CM | POA: Diagnosis not present

## 2021-01-08 DIAGNOSIS — E119 Type 2 diabetes mellitus without complications: Secondary | ICD-10-CM | POA: Diagnosis not present

## 2021-01-08 DIAGNOSIS — I10 Essential (primary) hypertension: Secondary | ICD-10-CM | POA: Diagnosis not present

## 2021-01-08 DIAGNOSIS — E782 Mixed hyperlipidemia: Secondary | ICD-10-CM | POA: Diagnosis not present

## 2021-01-08 DIAGNOSIS — E0590 Thyrotoxicosis, unspecified without thyrotoxic crisis or storm: Secondary | ICD-10-CM | POA: Diagnosis not present

## 2021-01-15 DIAGNOSIS — F411 Generalized anxiety disorder: Secondary | ICD-10-CM | POA: Diagnosis not present

## 2021-01-15 DIAGNOSIS — E1165 Type 2 diabetes mellitus with hyperglycemia: Secondary | ICD-10-CM | POA: Diagnosis not present

## 2021-01-15 DIAGNOSIS — Z1389 Encounter for screening for other disorder: Secondary | ICD-10-CM | POA: Diagnosis not present

## 2021-01-15 DIAGNOSIS — E7801 Familial hypercholesterolemia: Secondary | ICD-10-CM | POA: Diagnosis not present

## 2021-01-15 DIAGNOSIS — Z1331 Encounter for screening for depression: Secondary | ICD-10-CM | POA: Diagnosis not present

## 2021-01-15 DIAGNOSIS — I10 Essential (primary) hypertension: Secondary | ICD-10-CM | POA: Diagnosis not present

## 2022-07-18 IMAGING — US US THYROID
1 series · 13 of 25 positions shown · non-contrast
Comparison: None.

CLINICAL DATA: Neck fullness, subclinical hyperthyroidism

EXAM:
THYROID ULTRASOUND
TECHNIQUE: Ultrasound examination of the thyroid gland and adjacent soft
tissues was performed.

[Series 1: us thyroid · 13 of 82 slices shown]
[im 1/82]
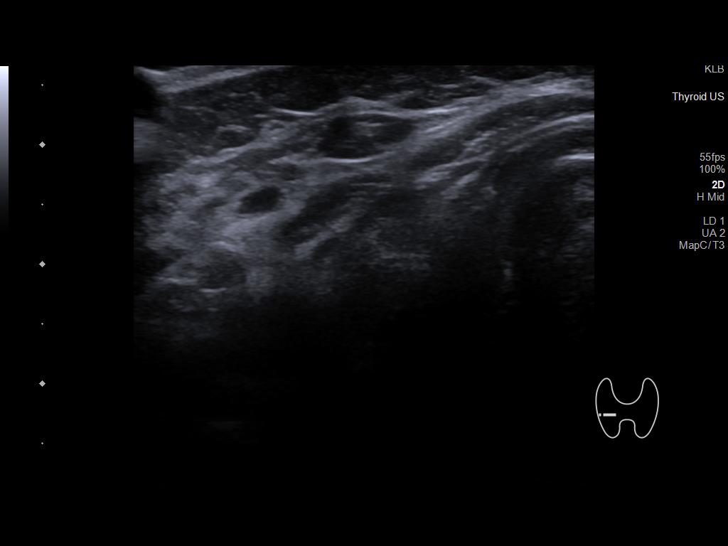
[im 7/82]
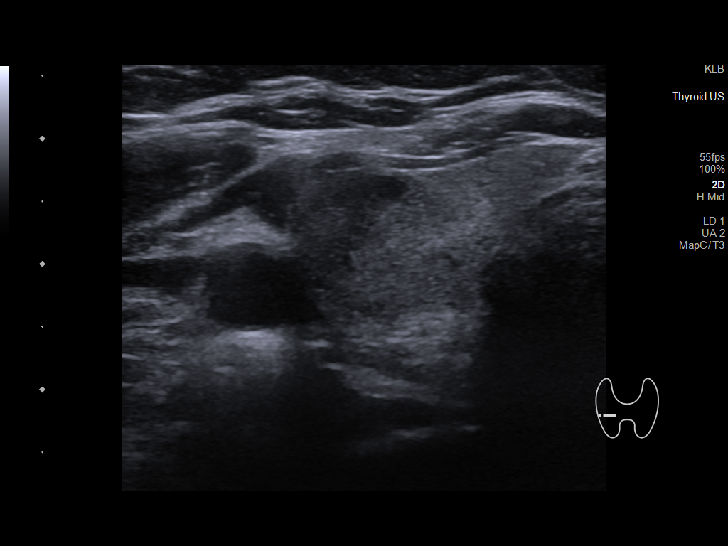
[im 14/82]
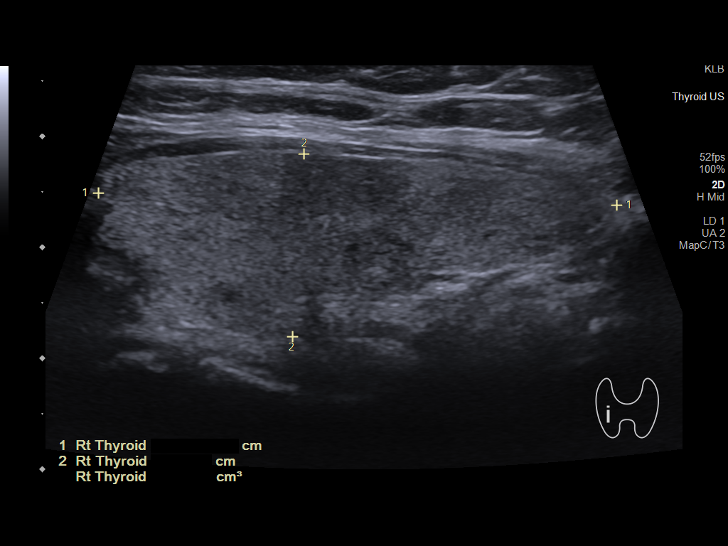
[im 21/82]
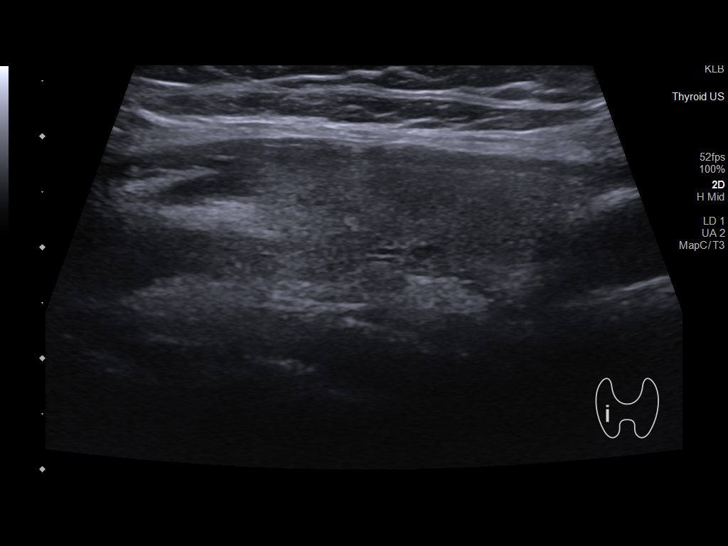
[im 28/82]
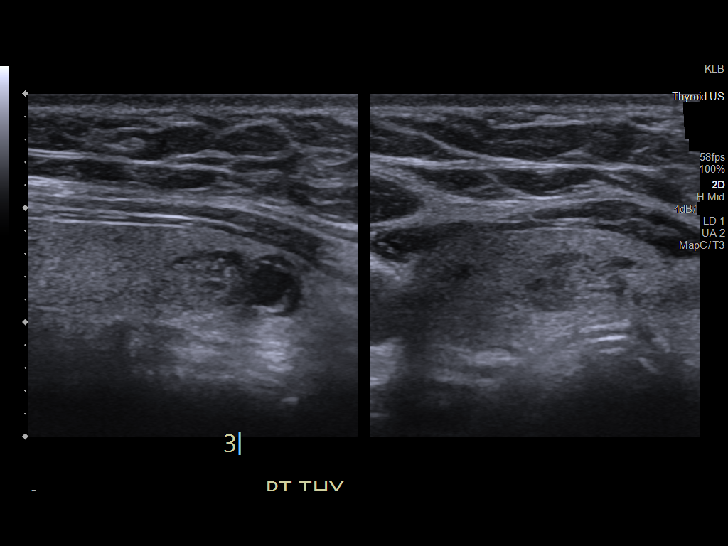
[im 34/82]
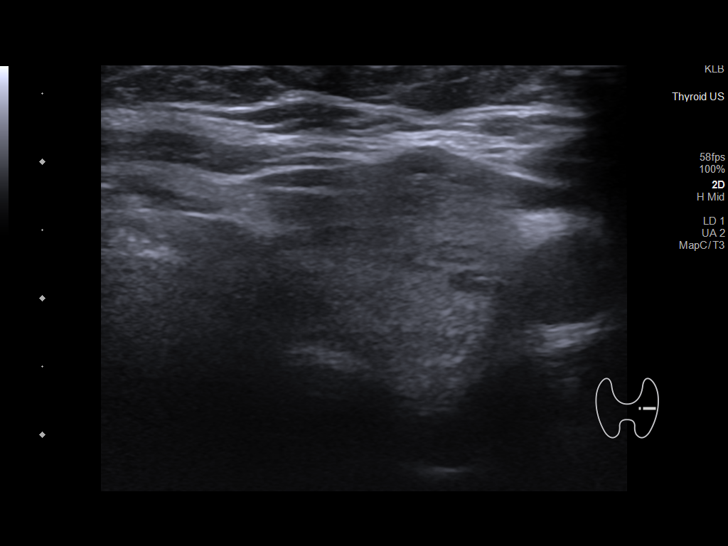
[im 41/82]
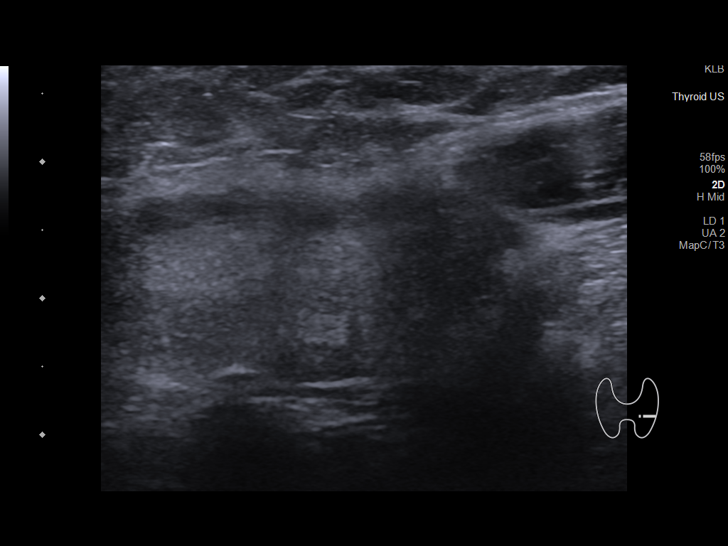
[im 48/82]
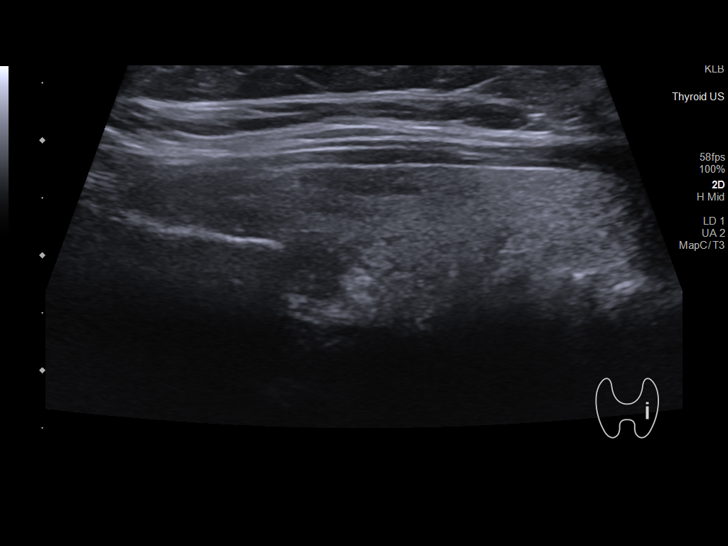
[im 55/82]
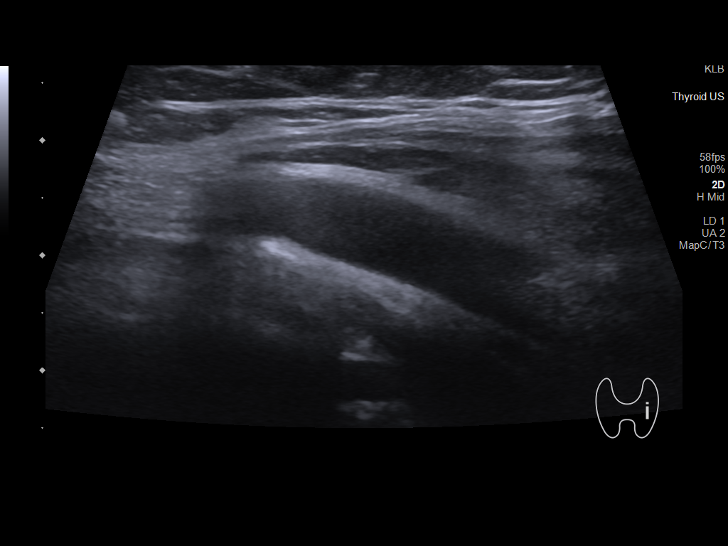
[im 61/82]
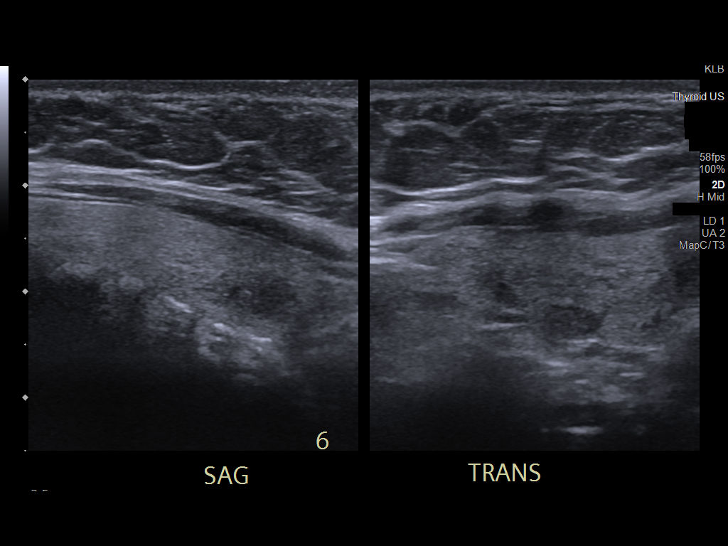
[im 68/82]
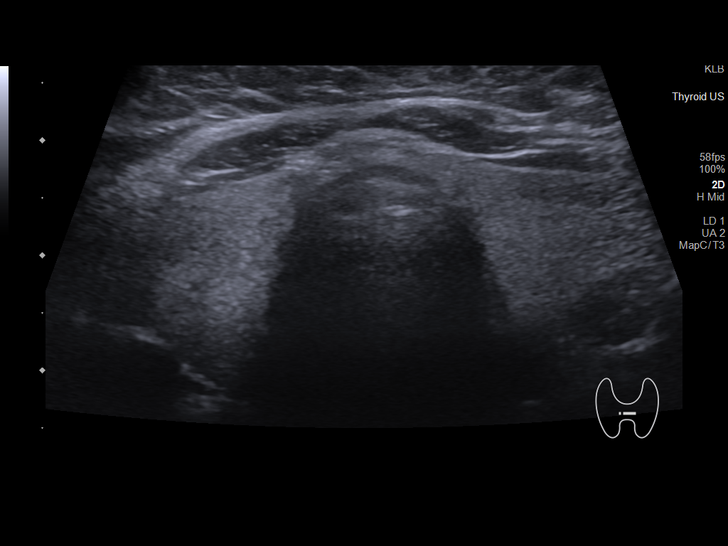
[im 75/82]
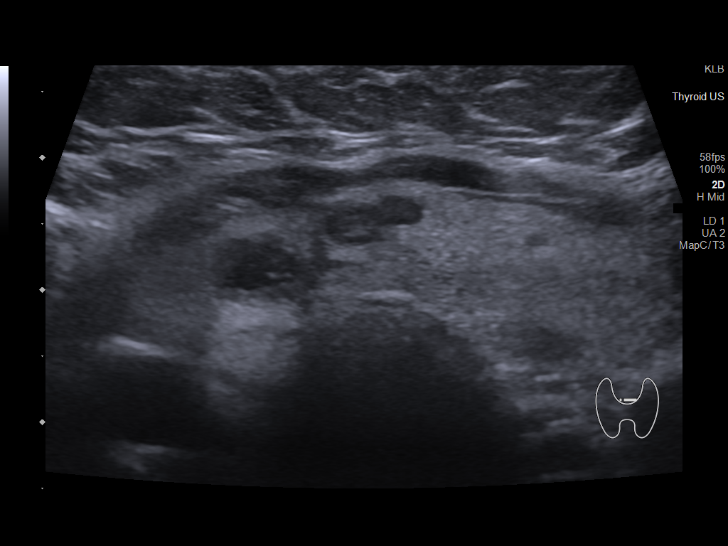
[im 82/82]
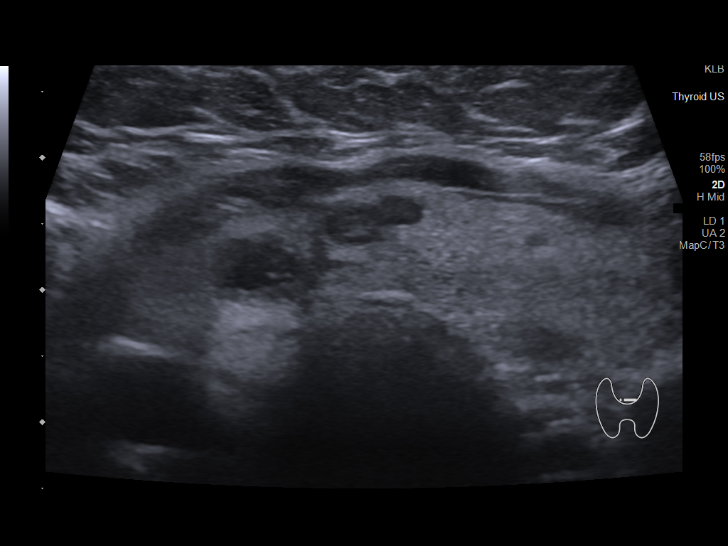

[13 of 25 positions shown; findings below may reference images not displayed]

FINDINGS: Parenchymal Echotexture: Mildly heterogenous

Isthmus: 9 mm

Right lobe: 4.7 x 1.7 x 1.5 cm

Left lobe: 4.8 x 1.5 x 1.8 cm

_________________________________________________________

Estimated total number of nodules >/= 1 cm: 1

Number of spongiform nodules >/=  2 cm not described below (TR1): 0

Number of mixed cystic and solid nodules >/= 1.5 cm not described
below (TR2): 0

_________________________________________________________

Nodule # 3:

Location: Right; Inferior

Maximum size: 1.3 cm; Other 2 dimensions: 0.6 x 0.9 cm

Composition: mixed cystic and solid (1)

Echogenicity: isoechoic (1)

Shape: not taller-than-wide (0)

Margins: smooth (0)

Echogenic foci: none (0)

ACR TI-RADS total points: 2.

ACR TI-RADS risk category: TR2 (2 points).

ACR TI-RADS recommendations:

This nodule does NOT meet TI-RADS criteria for biopsy or dedicated
follow-up.

_________________________________________________________

There are scattered additional subcentimeter nodules noted which are
predominately isoechoic and partially cystic. These all measuring 9
mm or less in size and would not meet criteria for any biopsy or
follow-up and are not fully described by TI rads criteria.

Nonspecific thyroid heterogeneity. No hypervascularity. No regional
adenopathy.
IMPRESSION: 1.3 cm right inferior TR 2 nodule does not meet criteria for biopsy
or follow-up.

Additional subcentimeter nodules noted.

No significant finding that warrants biopsy or follow-up.

The above is in keeping with the ACR TI-RADS recommendations - [HOSPITAL] 8594;[DATE].

## 2023-02-17 ENCOUNTER — Ambulatory Visit (INDEPENDENT_AMBULATORY_CARE_PROVIDER_SITE_OTHER): Payer: BC Managed Care – PPO | Admitting: Podiatry

## 2023-02-17 ENCOUNTER — Ambulatory Visit (INDEPENDENT_AMBULATORY_CARE_PROVIDER_SITE_OTHER): Payer: BC Managed Care – PPO

## 2023-02-17 ENCOUNTER — Encounter: Payer: Self-pay | Admitting: Podiatry

## 2023-02-17 VITALS — Ht 65.0 in | Wt 250.0 lb

## 2023-02-17 DIAGNOSIS — L97512 Non-pressure chronic ulcer of other part of right foot with fat layer exposed: Secondary | ICD-10-CM

## 2023-02-17 DIAGNOSIS — L089 Local infection of the skin and subcutaneous tissue, unspecified: Secondary | ICD-10-CM | POA: Diagnosis not present

## 2023-02-17 DIAGNOSIS — S90821D Blister (nonthermal), right foot, subsequent encounter: Secondary | ICD-10-CM

## 2023-02-17 MED ORDER — CIPROFLOXACIN HCL 500 MG PO TABS
500.0000 mg | ORAL_TABLET | Freq: Two times a day (BID) | ORAL | 0 refills | Status: AC
Start: 2023-02-17 — End: 2023-02-27

## 2023-02-17 MED ORDER — DOXYCYCLINE HYCLATE 100 MG PO TABS: 100.0000 mg | ORAL_TABLET | Freq: Two times a day (BID) | ORAL | 1 refills | Status: AC

## 2023-02-18 LAB — CBC WITH DIFFERENTIAL/PLATELET
Absolute Lymphocytes: 2183 {cells}/uL (ref 850–3900)
Absolute Monocytes: 555 {cells}/uL (ref 200–950)
Basophils Absolute: 24 {cells}/uL (ref 0–200)
Basophils Relative: 0.2 %
Eosinophils Absolute: 35 {cells}/uL (ref 15–500)
Eosinophils Relative: 0.3 %
HCT: 46.4 % — ABNORMAL HIGH (ref 35.0–45.0)
Hemoglobin: 15.4 g/dL (ref 11.7–15.5)
MCH: 28.1 pg (ref 27.0–33.0)
MCHC: 33.2 g/dL (ref 32.0–36.0)
MCV: 84.7 fL (ref 80.0–100.0)
MPV: 9.6 fL (ref 7.5–12.5)
Monocytes Relative: 4.7 %
Neutro Abs: 9003 {cells}/uL — ABNORMAL HIGH (ref 1500–7800)
Neutrophils Relative %: 76.3 %
Platelets: 368 10*3/uL (ref 140–400)
RBC: 5.48 10*6/uL — ABNORMAL HIGH (ref 3.80–5.10)
RDW: 12 % (ref 11.0–15.0)
Total Lymphocyte: 18.5 %
WBC: 11.8 10*3/uL — ABNORMAL HIGH (ref 3.8–10.8)

## 2023-02-18 LAB — SEDIMENTATION RATE: Sed Rate: 6 mm/h (ref 0–20)

## 2023-02-21 NOTE — Progress Notes (Signed)
Subjective:   Patient ID: Elizabeth Pennington, female   DOB: 37 y.o.   MRN: [id number]   HPI Patient has a breakdown of tissue in the plantar aspect of the right big toe after stepping on something.  Does not know specifically what and she was on doxycycline for a few days and was having the area packed up her presents to [place] for evaluation.  Patient does not smoke is not sure about her sugar control and just did have her A1c but does not have results and is moderately obese   ROS      Objective:  Physical Exam  Neurovascular status reveals diminishment of sharp dull vibratory with vascular status adequate.  Patient is noted to have a small breakdown of tissue plantar aspect right hallux that only measures around 5 x 5 mm in does not probe to bone with subcutaneous exposure but no proximal edema erythema noted currently.  Patient has good digital perfusion well-oriented x 3     Assessment:  Possibility for deep infection rule related to ulceration right hallux with difficulty in making complete determination as to control that she has for this currently.  Does have a superficial ulceration with no indication currently of spread past that point     Plan:  H&P x-rays reviewed I cleaned out the area flushed it applied Iodosorb begin wet-to-dry Betadine dressings at home had Dr. Allena Katz also look at this who is in agreement with me that at this point there is no high risk but there is still a chance of either infection getting worse for the possibility for amputation related to bone changes.  Patient is started on doxycycline and Cipro and will give [place] the results of both her blood work and she apparently had a culture done last week and we should be able to get the results for that.  If she is to develop any systemic signs of infection she is to go straight to the emergency room and I also went ahead today and I did dispense a wedge shoe so there is no pressure on the forefoot and we will check her back again  in the next week  X-rays indicate there is some reactivity on the medial side of the right hallux with the possibility that this is related to packing or there may be some osteolysis working to give it another week and then reevaluate and hopefully this will heal uneventfully

## 2023-02-24 ENCOUNTER — Ambulatory Visit (INDEPENDENT_AMBULATORY_CARE_PROVIDER_SITE_OTHER): Payer: BC Managed Care – PPO

## 2023-02-24 ENCOUNTER — Ambulatory Visit: Payer: BC Managed Care – PPO | Admitting: Podiatry

## 2023-02-24 DIAGNOSIS — L97512 Non-pressure chronic ulcer of other part of right foot with fat layer exposed: Secondary | ICD-10-CM

## 2023-02-28 NOTE — Progress Notes (Signed)
Subjective:   Patient ID: Elizabeth Pennington, female   DOB: 37 y.o.   MRN: [id number]   HPI Patient states doing quite a bit better and I am feeling a lot better and she has been using a knee scooter to help keep weight off the foot   ROS      Objective:  Physical Exam  Neurovascular status unchanged with keratotic plantar lesion right which is improving with no erythema edema drainage around it and no calor into the forefoot.  Patient does not have any probe beyond superficial     Assessment:  Ulceration plantar right hallux which appears to be doing much better and is filling in with a slight bit of exposure of approximate 4 x 4 mm     Plan:  H&P reviewed her and caregiver the continued progress and we will initiate wet-to-dry dressings using saline currently continue offloading and careful debridement accomplished.  I want to see back 2 weeks with strict instructions of any erythema edema or drainage were to occur that I would like to see her earlier and I also went ahead today and I did x-ray to confirm no changes bone structure wise  X-rays indicate that there is no lysis occurring around the area and it appears very stable

## 2023-03-19 ENCOUNTER — Ambulatory Visit (INDEPENDENT_AMBULATORY_CARE_PROVIDER_SITE_OTHER): Payer: BC Managed Care – PPO | Admitting: Podiatry

## 2023-03-19 ENCOUNTER — Encounter: Payer: Self-pay | Admitting: Podiatry

## 2023-03-19 DIAGNOSIS — L97512 Non-pressure chronic ulcer of other part of right foot with fat layer exposed: Secondary | ICD-10-CM | POA: Diagnosis not present

## 2023-03-21 NOTE — Progress Notes (Signed)
Subjective:   Patient ID: Elizabeth Pennington, female   DOB: 37 y.o.   MRN: [id number]   HPI Patient states she seems to be continuing to get better still wearing surgical shoe so far pleased   ROS      Objective:  Physical Exam  Neurovascular status unchanged with lesion on the subright hallux which continues to improve with only a small amount of crusted tissue formation currently     Assessment:  Right hallux that is healing well after having had an infection and is crusting over     Plan:  H&P and we will continue padding around the area return to soft shoe gear all questions answered today and may require still other procedures if this were to get bad again

## 2023-07-01 ENCOUNTER — Ambulatory Visit: Payer: Self-pay | Admitting: Surgery

## 2023-07-01 NOTE — Progress Notes (Addendum)
 COVID Vaccine Completed:  Date of COVID positive in last 90 days: no  PCP - Jerral Moos, PA Cardiologist - n/a  Chest x-ray - n/a EKG - 05/23/23 CEW Stress Test - n/a ECHO - n/a Cardiac Cath - n/a Pacemaker/ICD device last checked: n/a Spinal Cord Stimulator: n/a  Bowel Prep - no  Sleep Study - yes, positive CPAP - no  Fasting Blood Sugar - 150-160 Checks Blood Sugar 1 times a day  Last dose of GLP1 agonist-  Mounjaro, takes usually on Sundays GLP1 instructions:  Hold 7 days before surgery , last dose 06/28/23. Do not take 07/04/23   Last dose of SGLT-2 inhibitors-  N/A SGLT-2 instructions:  Hold 3 days before surgery    Blood Thinner Instructions:  Last dose:   Time: Aspirin Instructions: Last Dose:  Activity level: Can go up a flight of stairs and perform activities of daily living without stopping and without symptoms of chest pain or shortness of breath.  Anesthesia review: A1C 8.2, DM2, HTN  Patient denies shortness of breath, fever, cough and chest pain at PAT appointment  Patient verbalized understanding of instructions that were given to them at the PAT appointment. Patient was also instructed that they will need to review over the PAT instructions again at home before surgery.

## 2023-07-01 NOTE — Progress Notes (Signed)
 Please place orders for PAT appointment scheduled 07/02/23.

## 2023-07-01 NOTE — Patient Instructions (Signed)
 SURGICAL WAITING ROOM VISITATION  Patients having surgery or a procedure may have no more than 2 support people in the waiting area - these visitors may rotate.    Children under the age of 26 must have an adult with them who is not the patient.  Due to an increase in RSV and influenza rates and associated hospitalizations, children ages 36 and under may not visit patients in [facility] hospitals.  Visitors with respiratory illnesses are discouraged from visiting and should remain at home.  If the patient needs to stay at the hospital during part of their recovery, the visitor guidelines for inpatient rooms apply. Pre-op nurse will coordinate an appropriate time for 1 support person to accompany patient in pre-op.  This support person may not rotate.    Please refer to the [facility] website for the visitor guidelines for Inpatients (after your surgery is over and you are in a regular room).    Your procedure is scheduled on: 07/06/23   Report to [facility] Main Entrance    Report to admitting at 7:45 AM   Call this number if you have problems the morning of surgery [phone]   Do not eat food :After Midnight.   After Midnight you may have the following liquids until 7:00 AM DAY OF SURGERY  Water Non-Citrus Juices (without pulp, NO RED-Apple, White grape, White cranberry) Black Coffee (NO MILK/CREAM OR CREAMERS, sugar ok)  Clear Tea (NO MILK/CREAM OR CREAMERS, sugar ok) regular and decaf                             Plain Jell-O (NO RED)                                           Fruit ices (not with fruit pulp, NO RED)                                     Popsicles (NO RED)                                                               Sports drinks like Gatorade (NO RED)              Drink 2 Ensure/G2 drinks AT 10:00 PM the night before surgery.        The day of surgery:  Drink ONE (1) Pre-Surgery Clear Ensure or G2 at AM the morning of surgery. Drink in one  sitting. Do not sip.  This drink was given to you during your hospital  pre-op appointment visit. Nothing else to drink after completing the  Pre-Surgery Clear Ensure or G2.          If you have questions, please contact your surgeon's office.   FOLLOW BOWEL PREP AND ANY ADDITIONAL PRE OP INSTRUCTIONS YOU RECEIVED FROM YOUR SURGEON'S OFFICE!!!     Oral Hygiene is also important to reduce your risk of infection.  Remember - BRUSH YOUR TEETH THE MORNING OF SURGERY WITH YOUR REGULAR TOOTHPASTE  DENTURES WILL BE REMOVED PRIOR TO SURGERY PLEASE DO NOT APPLY "Poly grip" OR ADHESIVES!!!   Do NOT smoke after Midnight   Stop all vitamins and herbal supplements 7 days before surgery.   Take these medicines the morning of surgery with A SIP OF WATER: Rosuvastatin   DO NOT TAKE ANY ORAL DIABETIC MEDICATIONS DAY OF YOUR SURGERY  How to Manage Your Diabetes Before and After Surgery  Why is it important to control my blood sugar before and after surgery? Improving blood sugar levels before and after surgery helps healing and can limit problems. A way of improving blood sugar control is eating a healthy diet by:  Eating less sugar and carbohydrates  Increasing activity/exercise  Talking with your doctor about reaching your blood sugar goals High blood sugars (greater than 180 mg/dL) can raise your risk of infections and slow your recovery, so you will need to focus on controlling your diabetes during the weeks before surgery. Make sure that the doctor who takes care of your diabetes knows about your planned surgery including the date and location.  How do I manage my blood sugar before surgery? Check your blood sugar at least 4 times a day, starting 2 days before surgery, to make sure that the level is not too high or low. Check your blood sugar the morning of your surgery when you wake up and every 2 hours until you get to the Short Stay unit. If your blood  sugar is less than 70 mg/dL, you will need to treat for low blood sugar: Do not take insulin. Treat a low blood sugar (less than 70 mg/dL) with  cup of clear juice (cranberry or apple), 4 glucose tablets, OR glucose gel. Recheck blood sugar in 15 minutes after treatment (to make sure it is greater than 70 mg/dL). If your blood sugar is not greater than 70 mg/dL on recheck, call [phone] for further instructions. Report your blood sugar to the short stay nurse when you get to Short Stay.  If you are admitted to the hospital after surgery: Your blood sugar will be checked by the staff and you will probably be given insulin after surgery (instead of oral diabetes medicines) to make sure you have good blood sugar levels. The goal for blood sugar control after surgery is 80-180 mg/dL.   WHAT DO I DO ABOUT MY DIABETES MEDICATION?  Do not take oral diabetes medicines (pills) the morning of surgery.  Hold Glyxambi for 3 days prior to surgery. Last dose 07/02/23.  THE DAY BEFORE SURGERY, take Metformin as prescribed.     THE MORNING OF SURGERY, do not take Metformin.   DO NOT TAKE THE FOLLOWING 7 DAYS PRIOR TO SURGERY: Ozempic, Wegovy, Rybelsus (Semaglutide), Byetta (exenatide), Bydureon (exenatide ER), Victoza, Saxenda (liraglutide), or Trulicity (dulaglutide) Mounjaro (Tirzepatide) Adlyxin (Lixisenatide), Polyethylene Glycol Loxenatide.  Reviewed and Endorsed by [facility] Patient Education Committee, August 2015  Bring CPAP mask and tubing day of surgery.                              You may not have any metal on your body including hair pins, jewelry, and body piercing             Do not wear make-up, lotions, powders, perfumes, or deodorant  Do not wear nail polish including gel and S&S, artificial/acrylic nails, or  any other type of covering on natural nails including finger and toenails. If you have artificial nails, gel coating, etc. that needs to be removed by a nail salon please  have this removed prior to surgery or surgery may need to be canceled/ delayed if the surgeon/ anesthesia feels like they are unable to be safely monitored.   Do not shave  48 hours prior to surgery.    Do not bring valuables to the hospital. [facility] IS NOT             RESPONSIBLE   FOR VALUABLES.   Contacts, glasses, dentures or bridgework may not be worn into surgery.  DO NOT BRING YOUR HOME MEDICATIONS TO THE HOSPITAL. PHARMACY WILL DISPENSE MEDICATIONS LISTED ON YOUR MEDICATION LIST TO YOU DURING YOUR ADMISSION IN THE HOSPITAL!    Patients discharged on the day of surgery will not be allowed to drive home.  Someone NEEDS to stay with you for the first 24 hours after anesthesia.   Special Instructions: Bring a copy of your healthcare power of attorney and living will documents the day of surgery if you haven't scanned them before.              Please read over the following fact sheets you were given: IF YOU HAVE QUESTIONS ABOUT YOUR PRE-OP INSTRUCTIONS PLEASE CALL [phone] [facility]ras    If you received a COVID test during your pre-op visit  it is requested that you wear a mask when out in public, stay away from anyone that may not be feeling well and notify your surgeon if you develop symptoms. If you test positive for Covid or have been in contact with anyone that has tested positive in the last 10 days please notify you surgeon.    [facility] - Preparing for Surgery Before surgery, you can play an important role.  Because skin is not sterile, your skin needs to be as free of germs as possible.  You can reduce the number of germs on your skin by washing with CHG (chlorahexidine gluconate) soap before surgery.  CHG is an antiseptic cleaner which kills germs and bonds with the skin to continue killing germs even after washing. Please DO NOT use if you have an allergy to CHG or antibacterial soaps.  If your skin becomes reddened/irritated stop using the CHG and inform your nurse when  you arrive at Short Stay. Do not shave (including legs and underarms) for at least 48 hours prior to the first CHG shower.  You may shave your face/neck.  Please follow these instructions carefully:  1.  Shower with CHG Soap the night before surgery and the  morning of surgery.  2.  If you choose to wash your hair, wash your hair first as usual with your normal  shampoo.  3.  After you shampoo, rinse your hair and body thoroughly to remove the shampoo.                             4.  Use CHG as you would any other liquid soap.  You can apply chg directly to the skin and wash.  Gently with a scrungie or clean washcloth.  5.  Apply the CHG Soap to your body ONLY FROM THE NECK DOWN.   Do   not use on face/ open  Wound or open sores. Avoid contact with eyes, ears mouth and   genitals (private parts).                       Wash face,  Genitals (private parts) with your normal soap.             6.  Wash thoroughly, paying special attention to the area where your    surgery  will be performed.  7.  Thoroughly rinse your body with warm water from the neck down.  8.  DO NOT shower/wash with your normal soap after using and rinsing off the CHG Soap.                9.  Pat yourself dry with a clean towel.            10.  Wear clean pajamas.            11.  Place clean sheets on your bed the night of your first shower and do not  sleep with pets. Day of Surgery : Do not apply any lotions/deodorants the morning of surgery.  Please wear clean clothes to the hospital/surgery center.  FAILURE TO FOLLOW THESE INSTRUCTIONS MAY RESULT IN THE CANCELLATION OF YOUR SURGERY  PATIENT SIGNATURE_________________________________  NURSE SIGNATURE__________________________________  ________________________________________________________________________

## 2023-07-02 ENCOUNTER — Other Ambulatory Visit: Payer: Self-pay

## 2023-07-02 ENCOUNTER — Encounter (HOSPITAL_COMMUNITY): Payer: Self-pay

## 2023-07-02 ENCOUNTER — Encounter (HOSPITAL_COMMUNITY)
Admission: RE | Admit: 2023-07-02 | Discharge: 2023-07-02 | Disposition: A | Discharge status: Home / Self Care | Source: Ambulatory Visit | Attending: Surgery | Admitting: Surgery

## 2023-07-02 VITALS — BP 118/70 | HR 79 | Temp 98.0°F | Resp 16 | Ht 65.0 in | Wt 230.0 lb

## 2023-07-02 DIAGNOSIS — I10 Essential (primary) hypertension: Secondary | ICD-10-CM | POA: Diagnosis not present

## 2023-07-02 DIAGNOSIS — Z01812 Encounter for preprocedural laboratory examination: Secondary | ICD-10-CM | POA: Insufficient documentation

## 2023-07-02 DIAGNOSIS — K8000 Calculus of gallbladder with acute cholecystitis without obstruction: Secondary | ICD-10-CM | POA: Insufficient documentation

## 2023-07-02 DIAGNOSIS — Z794 Long term (current) use of insulin: Secondary | ICD-10-CM | POA: Diagnosis not present

## 2023-07-02 DIAGNOSIS — E119 Type 2 diabetes mellitus without complications: Secondary | ICD-10-CM | POA: Insufficient documentation

## 2023-07-02 DIAGNOSIS — Z7984 Long term (current) use of oral hypoglycemic drugs: Secondary | ICD-10-CM | POA: Insufficient documentation

## 2023-07-02 HISTORY — DX: Type 2 diabetes mellitus without complications: E11.9

## 2023-07-02 HISTORY — DX: Disorder of thyroid, unspecified: E07.9

## 2023-07-02 LAB — CBC
HCT: 43.8 % (ref 36.0–46.0)
Hemoglobin: 14.7 g/dL (ref 12.0–15.0)
MCH: 28.4 pg (ref 26.0–34.0)
MCHC: 33.6 g/dL (ref 30.0–36.0)
MCV: 84.7 fL (ref 80.0–100.0)
Platelets: 306 10*3/uL (ref 150–400)
RBC: 5.17 MIL/uL — ABNORMAL HIGH (ref 3.87–5.11)
RDW: 11.9 % (ref 11.5–15.5)
WBC: 11.8 10*3/uL — ABNORMAL HIGH (ref 4.0–10.5)
nRBC: 0 % (ref 0.0–0.2)

## 2023-07-02 LAB — BASIC METABOLIC PANEL WITH GFR
Anion gap: 9 (ref 5–15)
BUN: 14 mg/dL (ref 6–20)
CO2: 22 mmol/L (ref 22–32)
Calcium: 9.2 mg/dL (ref 8.9–10.3)
Chloride: 102 mmol/L (ref 98–111)
Creatinine, Ser: 0.39 mg/dL — ABNORMAL LOW (ref 0.44–1.00)
GFR, Estimated: >60 mL/min (ref >60)
Glucose, Bld: 228 mg/dL — ABNORMAL HIGH (ref 70–99)
Potassium: 4.3 mmol/L (ref 3.5–5.1)
Sodium: 133 mmol/L — ABNORMAL LOW (ref 135–145)

## 2023-07-02 LAB — GLUCOSE, CAPILLARY: Glucose-Capillary: 218 mg/dL — ABNORMAL HIGH (ref 70–99)

## 2023-07-03 LAB — HEMOGLOBIN A1C
Hgb A1c MFr Bld: 8.2 % — ABNORMAL HIGH (ref 4.8–5.6)
Mean Plasma Glucose: 189 mg/dL

## 2023-07-05 ENCOUNTER — Encounter (HOSPITAL_COMMUNITY): Payer: Self-pay

## 2023-07-05 NOTE — Progress Notes (Signed)
 Anesthesia Chart Review   Case: [id number] Date/Time: 07/06/23 0945   Procedure: LAPAROSCOPIC CHOLECYSTECTOMY   Anesthesia type: General   Diagnosis: Calculus of gallbladder with acute cholecystitis without obstruction [K80.00]   Pre-op diagnosis: CHOLECYSTITIS   Location: [facility] ROOM 02 / [facility]   Surgeons: [name], MD       DISCUSSION:37 y.o. never smoker with h/o HTN, DM II, Lap-Band in place, cholecystitis scheduled for above procedure 07/06/2023 with Dr. Teddie Favre.   Patient with diabetes type 2.  Currently on Mounjaro and metformin.  Advised to hold Mounjaro 1 week prior to procedure by PAT nurse.  Elevated A1c at 8.2.  CBG nonfasting 218 at PAT visit.  Labs forwarded to Dr. Marny Sires and PCP.  Evaluate CBG day of surgery. VS: BP 118/70   Pulse 79   Temp 36.7 C (Oral)   Resp 16   Ht 5\' 5"  (1.651 m)   Wt 104.3 kg   LMP 06/18/2023 (Approximate) Comment: IUD  SpO2 100%   BMI 38.27 kg/m   PROVIDERS: Anthonette Bastos, PA-C is PCP    LABS: Labs reviewed: Acceptable for surgery. (all labs ordered are listed, but only abnormal results are displayed)  Labs Reviewed  BASIC METABOLIC PANEL WITH GFR - Abnormal; Notable for the following components:      Result Value   Sodium 133 (*)    Glucose, Bld 228 (*)    Creatinine, Ser 0.39 (*)    All other components within normal limits  CBC - Abnormal; Notable for the following components:   WBC 11.8 (*)    RBC 5.17 (*)    All other components within normal limits  GLUCOSE, CAPILLARY - Abnormal; Notable for the following components:   Glucose-Capillary 218 (*)    All other components within normal limits  HEMOGLOBIN A1C - Abnormal; Notable for the following components:   Hgb A1c MFr Bld 8.2 (*)    All other components within normal limits     IMAGES:   EKG:   CV:  Past Medical History:  Diagnosis Date   Gestational diabetes    Hypertension    Thyroid condition    hypo and hyper    Past Surgical  History:  Procedure Laterality Date   CESAREAN SECTION     x2   GASTRIC BYPASS      MEDICATIONS:  CINNAMON PO   doxycycline (VIBRA-TABS) 100 MG tablet   esomeprazole (NEXIUM) 40 MG capsule   GLYXAMBI 25-5 MG TABS   lisinopril (ZESTRIL) 20 MG tablet   metFORMIN (GLUCOPHAGE-XR) 500 MG 24 hr tablet   Misc Natural Products (CORTISOL PO)   MOUNJARO 10 MG/0.5ML Pen   Multiple Vitamins-Minerals (WOMENS MULTIVITAMIN) TABS   Omega-3 Fatty Acids (FISH OIL PO)   ondansetron (ZOFRAN-ODT) 4 MG disintegrating tablet   rosuvastatin (CRESTOR) 5 MG tablet   No current facility-administered medications for this encounter.     Chick Cotton Ward, PA-C [facility]-Surgical Testing [phone]

## 2023-07-06 ENCOUNTER — Ambulatory Visit (HOSPITAL_COMMUNITY): Payer: Self-pay | Admitting: Anesthesiology

## 2023-07-06 ENCOUNTER — Other Ambulatory Visit: Payer: Self-pay

## 2023-07-06 ENCOUNTER — Encounter (HOSPITAL_COMMUNITY)
Admission: RE | Disposition: A | Discharge status: Home / Self Care | Payer: Self-pay | Source: Ambulatory Visit | Attending: Surgery

## 2023-07-06 ENCOUNTER — Ambulatory Visit (HOSPITAL_COMMUNITY): Payer: Self-pay | Admitting: Physician Assistant

## 2023-07-06 ENCOUNTER — Ambulatory Visit (HOSPITAL_COMMUNITY)
Admission: RE | Admit: 2023-07-06 | Discharge: 2023-07-06 | Disposition: A | Discharge status: Home / Self Care | Source: Ambulatory Visit | Attending: Surgery | Admitting: Surgery

## 2023-07-06 ENCOUNTER — Encounter (HOSPITAL_COMMUNITY): Payer: Self-pay | Admitting: Surgery

## 2023-07-06 DIAGNOSIS — Z9884 Bariatric surgery status: Secondary | ICD-10-CM | POA: Insufficient documentation

## 2023-07-06 DIAGNOSIS — K8000 Calculus of gallbladder with acute cholecystitis without obstruction: Secondary | ICD-10-CM | POA: Diagnosis present

## 2023-07-06 DIAGNOSIS — Z7984 Long term (current) use of oral hypoglycemic drugs: Secondary | ICD-10-CM | POA: Diagnosis not present

## 2023-07-06 DIAGNOSIS — K8012 Calculus of gallbladder with acute and chronic cholecystitis without obstruction: Secondary | ICD-10-CM | POA: Insufficient documentation

## 2023-07-06 DIAGNOSIS — Z6838 Body mass index (BMI) 38.0-38.9, adult: Secondary | ICD-10-CM | POA: Diagnosis not present

## 2023-07-06 DIAGNOSIS — E119 Type 2 diabetes mellitus without complications: Secondary | ICD-10-CM | POA: Diagnosis not present

## 2023-07-06 DIAGNOSIS — Z7985 Long-term (current) use of injectable non-insulin antidiabetic drugs: Secondary | ICD-10-CM | POA: Insufficient documentation

## 2023-07-06 DIAGNOSIS — E669 Obesity, unspecified: Secondary | ICD-10-CM | POA: Diagnosis not present

## 2023-07-06 DIAGNOSIS — I10 Essential (primary) hypertension: Secondary | ICD-10-CM | POA: Diagnosis not present

## 2023-07-06 HISTORY — PX: CHOLECYSTECTOMY: SHX55

## 2023-07-06 LAB — GLUCOSE, CAPILLARY
Glucose-Capillary: 184 mg/dL — ABNORMAL HIGH (ref 70–99)
Glucose-Capillary: 195 mg/dL — ABNORMAL HIGH (ref 70–99)
Glucose-Capillary: 207 mg/dL — ABNORMAL HIGH (ref 70–99)

## 2023-07-06 LAB — POCT PREGNANCY, URINE: Preg Test, Ur: NEGATIVE

## 2023-07-06 SURGERY — LAPAROSCOPIC CHOLECYSTECTOMY
Anesthesia: General

## 2023-07-06 MED ORDER — LIDOCAINE HCL (CARDIAC) PF 100 MG/5ML IV SOSY
PREFILLED_SYRINGE | INTRAVENOUS | Status: DC | PRN
  Administered 2023-07-06: 60 mg via INTRAVENOUS

## 2023-07-06 MED ORDER — INSULIN ASPART 100 UNIT/ML IJ SOLN
INTRAMUSCULAR | Status: AC
  Administered 2023-07-06: 4 [IU] via SUBCUTANEOUS
  Filled 2023-07-06: qty 1

## 2023-07-06 MED ORDER — MIDAZOLAM HCL 2 MG/2ML IJ SOLN
INTRAMUSCULAR | Status: DC | PRN
  Administered 2023-07-06: 2 mg via INTRAVENOUS

## 2023-07-06 MED ORDER — LACTATED RINGERS IV SOLN
INTRAVENOUS | Status: DC | PRN
Start: 2023-07-06 — End: 2023-07-06
  Administered 2023-07-06: 1000 mL

## 2023-07-06 MED ORDER — ORAL CARE MOUTH RINSE: 15.0000 mL | Freq: Once | OROMUCOSAL | Status: AC

## 2023-07-06 MED ORDER — OXYCODONE HCL 5 MG PO TABS
5.0000 mg | ORAL_TABLET | Freq: Once | ORAL | Status: AC | PRN
  Administered 2023-07-06: 5 mg via ORAL

## 2023-07-06 MED ORDER — ROCURONIUM BROMIDE 10 MG/ML (PF) SYRINGE
PREFILLED_SYRINGE | INTRAVENOUS | Status: AC
  Filled 2023-07-06: qty 10

## 2023-07-06 MED ORDER — CEFAZOLIN SODIUM-DEXTROSE 2-4 GM/100ML-% IV SOLN
2.0000 g | INTRAVENOUS | Status: AC
  Administered 2023-07-06: 2 g via INTRAVENOUS
  Filled 2023-07-06: qty 100

## 2023-07-06 MED ORDER — METHOCARBAMOL 750 MG PO TABS: 750.0000 mg | ORAL_TABLET | Freq: Four times a day (QID) | ORAL | 0 refills | Status: AC | PRN

## 2023-07-06 MED ORDER — FENTANYL CITRATE PF 50 MCG/ML IJ SOSY
PREFILLED_SYRINGE | INTRAMUSCULAR | Status: DC
Start: 2023-07-06 — End: 2023-07-06
  Filled 2023-07-06: qty 1

## 2023-07-06 MED ORDER — DEXAMETHASONE SODIUM PHOSPHATE 10 MG/ML IJ SOLN
INTRAMUSCULAR | Status: DC | PRN
  Administered 2023-07-06: 4 mg via INTRAVENOUS

## 2023-07-06 MED ORDER — ACETAMINOPHEN 500 MG PO TABS: 1000.0000 mg | ORAL_TABLET | Freq: Once | ORAL | Status: DC

## 2023-07-06 MED ORDER — LIDOCAINE HCL (PF) 2 % IJ SOLN
INTRAMUSCULAR | Status: AC
  Filled 2023-07-06: qty 5

## 2023-07-06 MED ORDER — MIDAZOLAM HCL 2 MG/2ML IJ SOLN
INTRAMUSCULAR | Status: AC
  Filled 2023-07-06: qty 2

## 2023-07-06 MED ORDER — AMISULPRIDE (ANTIEMETIC) 5 MG/2ML IV SOLN: 10.0000 mg | Freq: Once | INTRAVENOUS | Status: DC | PRN

## 2023-07-06 MED ORDER — SUGAMMADEX SODIUM 200 MG/2ML IV SOLN
INTRAVENOUS | Status: DC | PRN
  Administered 2023-07-06 (×2): 200 mg via INTRAVENOUS

## 2023-07-06 MED ORDER — DEXAMETHASONE SODIUM PHOSPHATE 10 MG/ML IJ SOLN
INTRAMUSCULAR | Status: AC
  Filled 2023-07-06: qty 1

## 2023-07-06 MED ORDER — SCOPOLAMINE 1 MG/3DAYS TD PT72
1.0000 | MEDICATED_PATCH | TRANSDERMAL | Status: DC
  Administered 2023-07-06: 1.5 mg via TRANSDERMAL

## 2023-07-06 MED ORDER — OXYCODONE HCL 5 MG PO TABS
ORAL_TABLET | ORAL | Status: AC
  Filled 2023-07-06: qty 1

## 2023-07-06 MED ORDER — SODIUM CHLORIDE 0.9 % IV SOLN: 12.5000 mg | INTRAVENOUS | Status: DC | PRN

## 2023-07-06 MED ORDER — ROCURONIUM BROMIDE 10 MG/ML (PF) SYRINGE
PREFILLED_SYRINGE | INTRAVENOUS | Status: DC | PRN
  Administered 2023-07-06 (×2): 20 mg via INTRAVENOUS
  Administered 2023-07-06: 60 mg via INTRAVENOUS

## 2023-07-06 MED ORDER — BUPIVACAINE-EPINEPHRINE 0.25% -1:200000 IJ SOLN
INTRAMUSCULAR | Status: DC | PRN
  Administered 2023-07-06: 30 mL

## 2023-07-06 MED ORDER — ONDANSETRON HCL 4 MG/2ML IJ SOLN
INTRAMUSCULAR | Status: DC | PRN
  Administered 2023-07-06: 4 mg via INTRAVENOUS

## 2023-07-06 MED ORDER — CHLORHEXIDINE GLUCONATE 0.12 % MT SOLN
15.0000 mL | Freq: Once | OROMUCOSAL | Status: AC
  Administered 2023-07-06: 15 mL via OROMUCOSAL

## 2023-07-06 MED ORDER — ONDANSETRON HCL 4 MG/2ML IJ SOLN
INTRAMUSCULAR | Status: AC
  Filled 2023-07-06: qty 2

## 2023-07-06 MED ORDER — FENTANYL CITRATE (PF) 250 MCG/5ML IJ SOLN
INTRAMUSCULAR | Status: DC | PRN
  Administered 2023-07-06: 50 ug via INTRAVENOUS
  Administered 2023-07-06: 100 ug via INTRAVENOUS
  Administered 2023-07-06 (×2): 50 ug via INTRAVENOUS

## 2023-07-06 MED ORDER — SCOPOLAMINE 1 MG/3DAYS TD PT72
MEDICATED_PATCH | TRANSDERMAL | Status: AC
  Filled 2023-07-06: qty 1

## 2023-07-06 MED ORDER — PROPOFOL 10 MG/ML IV BOLUS
INTRAVENOUS | Status: AC
  Filled 2023-07-06: qty 20

## 2023-07-06 MED ORDER — BUPIVACAINE-EPINEPHRINE (PF) 0.25% -1:200000 IJ SOLN
INTRAMUSCULAR | Status: AC
  Filled 2023-07-06: qty 60

## 2023-07-06 MED ORDER — PROPOFOL 10 MG/ML IV BOLUS
INTRAVENOUS | Status: DC | PRN
  Administered 2023-07-06: 200 mg via INTRAVENOUS

## 2023-07-06 MED ORDER — INSULIN ASPART 100 UNIT/ML IJ SOLN
INTRAMUSCULAR | Status: AC
  Filled 2023-07-06: qty 1

## 2023-07-06 MED ORDER — ACETAMINOPHEN 500 MG PO TABS
1000.0000 mg | ORAL_TABLET | ORAL | Status: AC
  Administered 2023-07-06: 1000 mg via ORAL
  Filled 2023-07-06: qty 2

## 2023-07-06 MED ORDER — OXYCODONE-ACETAMINOPHEN 5-325 MG PO TABS: 1.0000 | ORAL_TABLET | ORAL | 0 refills | Status: AC | PRN

## 2023-07-06 MED ORDER — FENTANYL CITRATE (PF) 250 MCG/5ML IJ SOLN
INTRAMUSCULAR | Status: AC
  Filled 2023-07-06: qty 5

## 2023-07-06 MED ORDER — CHLORHEXIDINE GLUCONATE CLOTH 2 % EX PADS: 6.0000 | MEDICATED_PAD | Freq: Once | CUTANEOUS | Status: DC

## 2023-07-06 MED ORDER — LACTATED RINGERS IV SOLN: INTRAVENOUS | Status: DC

## 2023-07-06 MED ORDER — INSULIN ASPART 100 UNIT/ML IJ SOLN
0.0000 [IU] | INTRAMUSCULAR | Status: AC | PRN
  Administered 2023-07-06: 4 [IU] via SUBCUTANEOUS

## 2023-07-06 MED ORDER — OXYCODONE HCL 5 MG/5ML PO SOLN: 5.0000 mg | Freq: Once | ORAL | Status: AC | PRN

## 2023-07-06 MED ORDER — FENTANYL CITRATE PF 50 MCG/ML IJ SOSY
25.0000 ug | PREFILLED_SYRINGE | INTRAMUSCULAR | Status: DC | PRN
  Administered 2023-07-06 (×2): 50 ug via INTRAVENOUS

## 2023-07-06 MED ORDER — FENTANYL CITRATE PF 50 MCG/ML IJ SOSY
PREFILLED_SYRINGE | INTRAMUSCULAR | Status: AC
  Filled 2023-07-06: qty 1

## 2023-07-06 MED ORDER — 0.9 % SODIUM CHLORIDE (POUR BTL) OPTIME
TOPICAL | Status: DC | PRN
  Administered 2023-07-06: 1000 mL

## 2023-07-06 SURGICAL SUPPLY — 36 items
APPLIER CLIP ROT 10 11.4 M/L (STAPLE) ×1 IMPLANT
BAG COUNTER SPONGE SURGICOUNT (BAG) IMPLANT
CABLE HIGH FREQUENCY MONO STRZ (ELECTRODE) ×2 IMPLANT
CATH URETL OPEN 5X70 (CATHETERS) IMPLANT
CHLORAPREP W/TINT 26 (MISCELLANEOUS) ×2 IMPLANT
CLIP APPLIE ROT 10 11.4 M/L (STAPLE) ×2 IMPLANT
COVER MAYO STAND XLG (MISCELLANEOUS) ×2 IMPLANT
COVER SURGICAL LIGHT HANDLE (MISCELLANEOUS) ×2 IMPLANT
DERMABOND ADVANCED .7 DNX12 (GAUZE/BANDAGES/DRESSINGS) ×2 IMPLANT
DERMABOND ADVANCED .7 DNX6 (GAUZE/BANDAGES/DRESSINGS) IMPLANT
DRAPE C-ARM 42X120 X-RAY (DRAPES) IMPLANT
ELECT REM PT RETURN 15FT ADLT (MISCELLANEOUS) ×2 IMPLANT
ENDOLOOP SUT PDS II 0 18 (SUTURE) ×2 IMPLANT
GLOVE BIO SURGEON STRL SZ7.5 (GLOVE) ×2 IMPLANT
GLOVE INDICATOR 8.0 STRL GRN (GLOVE) ×2 IMPLANT
GOWN STRL REUS W/ TWL XL LVL3 (GOWN DISPOSABLE) ×4 IMPLANT
GRASPER SUT TROCAR 14GX15 (MISCELLANEOUS) IMPLANT
HEMOSTAT SNOW SURGICEL 2X4 (HEMOSTASIS) IMPLANT
IRRIG SUCT STRYKERFLOW 2 WTIP (MISCELLANEOUS) ×1 IMPLANT
IRRIGATION SUCT STRKRFLW 2 WTP (MISCELLANEOUS) ×2 IMPLANT
IV CATH 14GX2 1/4 (CATHETERS) ×2 IMPLANT
KIT BASIN OR (CUSTOM PROCEDURE TRAY) ×2 IMPLANT
KIT TURNOVER KIT A (KITS) IMPLANT
NDL INSUFFLATION 14GA 120MM (NEEDLE) ×2 IMPLANT
NEEDLE INSUFFLATION 14GA 120MM (NEEDLE) ×1 IMPLANT
POUCH RETRIEVAL ECOSAC 10 (ENDOMECHANICALS) ×2 IMPLANT
SCISSORS LAP 5X35 DISP (ENDOMECHANICALS) ×2 IMPLANT
SET TUBE SMOKE EVAC HIGH FLOW (TUBING) ×2 IMPLANT
SLEEVE Z-THREAD 5X100MM (TROCAR) ×4 IMPLANT
SPIKE FLUID TRANSFER (MISCELLANEOUS) ×2 IMPLANT
STOPCOCK 4 WAY LG BORE MALE ST (IV SETS) IMPLANT
SUT MNCRL AB 4-0 PS2 18 (SUTURE) ×2 IMPLANT
TOWEL OR 17X26 10 PK STRL BLUE (TOWEL DISPOSABLE) ×2 IMPLANT
TRAY LAPAROSCOPIC (CUSTOM PROCEDURE TRAY) ×2 IMPLANT
TROCAR ADV FIXATION 12X100MM (TROCAR) ×2 IMPLANT
TROCAR Z-THREAD OPTICAL 5X100M (TROCAR) ×2 IMPLANT

## 2023-07-06 NOTE — Anesthesia Procedure Notes (Signed)
 Procedure Name: Intubation Date/Time: 07/06/2023 9:47 AM  Performed by: Vergia Glasgow, CRNAPre-anesthesia Checklist: Patient identified, Emergency Drugs available, Suction available, Patient being monitored and Timeout performed Patient Re-evaluated:Patient Re-evaluated prior to induction Oxygen Delivery Method: Circle system utilized Preoxygenation: Pre-oxygenation with 100% oxygen Induction Type: IV induction Ventilation: Mask ventilation without difficulty Laryngoscope Size: Miller and 3 Grade View: Grade I Tube type: Oral Tube size: 7.0 mm Number of attempts: 2 Airway Equipment and Method: Stylet Placement Confirmation: ETT inserted through vocal cords under direct vision, positive ETCO2 and breath sounds checked- equal and bilateral Secured at: 23 cm Tube secured with: Tape Dental Injury: Teeth and Oropharynx as per pre-operative assessment  Comments: DL by paramedic student but did not attempt intubation.   DL by CRNA - esophageal intubation.   Repositioned head - DL by CRNA with full view of cords.  Patient tolerated well.

## 2023-07-06 NOTE — Op Note (Signed)
 Patient: Elizabeth Pennington (06-04-1985, [id number])  Date of Surgery: 07/06/2023  Preoperative Diagnosis: CHOLECYSTITIS   Postoperative Diagnosis: CHOLECYSTITIS   Surgical Procedure: LAPAROSCOPIC CHOLECYSTECTOMY  Operative Team Members:  Surgeons and Role:    * Ishani Goldwasser, Hyman Hopes, MD - Primary   Anesthesiologist: Beryle Lathe, MD   Anesthesia: General   Fluids:  Total I/O In: 100 [IV Piggyback:100] Out: -   Complications: None  Drains:  none   Specimen:  ID Type Source Tests Collected by Time Destination  1 : gallbladder Tissue PATH Other SURGICAL PATHOLOGY Elizabeth Pennington, Hyman Hopes, MD 07/06/2023 1004      Disposition:  PACU - hemodynamically stable.  Plan of Care: Discharge to home after PACU    Indications for Procedure: Elizabeth Pennington is a 38 y.o. female who presented with abdominal pain.  History, physical and imaging was concerning for cholecystitis.  Laparoscopic cholecystectomy was recommended for the patient.  The procedure itself, as well as the risks, benefits and alternatives were discussed with the patient.  Risks discussed included but were not limited to the risk of infection, bleeding, damage to nearby structures, need to convert to open procedure, incisional hernia, bile leak, common bile duct injury and the need for additional procedures or surgeries.  With this discussion complete and all questions answered the patient granted consent to proceed.  Findings: Gallstones and inflamed gallbladder  Infection status: Patient: Private Patient Elective Case Case: Elective Infection Present At Time Of Surgery (PATOS):  Inflamed gallbladder   Description of Procedure:   On the date stated above, the patient was taken to the operating room suite and placed in supine positioning.  Sequential compression devices were placed on the lower extremities to prevent blood clots.  General endotracheal anesthesia was induced. Preoperative antibiotics were given.  The patient's  abdomen was prepped and draped in the usual sterile fashion.  A time-out was completed verifying the correct patient, procedure, positioning and equipment needed for the case.  We began by anesthetizing the skin with local anesthetic and then making a 5 mm incision just below the umbilicus.  We dissected through the subcutaneous tissues to the fascia.  The fascia was grasped and elevated using a Kocher clamp.  A Veress needle was inserted into the abdomen and the abdomen was insufflated to 15 mmHg.  A 5 mm trocar was inserted in this position under optical guidance and then the abdomen was inspected.  There was no trauma to the underlying viscera with initial trocar placement.  Any abnormal findings, other than inflammation in the right upper quadrant, are listed above in the findings section.  Three additional trocars were placed, one 12 mm trocar in the subxiphoid position, one 5 mm trocar in the midline epigastric area and one 5mm trocar in the right upper quadrant subcostally.  These were placed under direct vision without any trauma to the underlying viscera.    The patient was then placed in head up, left side down positioning.  The gallbladder was identified and dissected free from its attachments to the omentum allowing the duodenum to fall away.  The infundibulum of the gallbladder was dissected free working laterally to medially.  The cystic duct and cystic artery were dissected free from surrounding connective tissue.  The infundibulum of the gallbladder was dissected off the cystic plate.  A critical view of safety was obtained with the cystic duct and cystic artery being cleared of connective tissues and clearly the only two structures entering into the gallbladder with the liver  clearly visible behind.  Clips were then applied to the cystic duct and cystic artery and then these structures were divided.  A PDS endoloop was placed on the cystic duct stump. The gallbladder was dissected off the cystic  plate, placed in an endocatch bag and removed from the 12 mm subxiphoid port site.  The clips were inspected and appeared effective.  The cystic plate was inspected and hemostasis was obtained using electrocautery.  A suction irrigator was used to clean the operative field.  Attention was turned to closure.  The 12 mm subxiphoid port site was closed using a 0-vicryl suture on a fascial suture passer.  The abdomen was desufflated.  The skin was closed using 4-0 monocryl and dermabond.  All sponge and needle counts were correct at the conclusion of the case.    Teddie Favre, MD General, Bariatric, & Minimally Invasive Surgery [facility], [place]

## 2023-07-06 NOTE — Anesthesia Preprocedure Evaluation (Addendum)
 Anesthesia Evaluation  Patient identified by MRN, date of birth, ID band Patient awake    Reviewed: Allergy & Precautions, NPO status , Patient's Chart, lab work & pertinent test results  History of Anesthesia Complications Negative for: history of anesthetic complications  Airway Mallampati: II  TM Distance: >3 FB Neck ROM: Full    Dental  (+) Dental Advisory Given, Partial Upper, Chipped   Pulmonary neg pulmonary ROS   Pulmonary exam normal        Cardiovascular hypertension, Pt. on medications Normal cardiovascular exam     Neuro/Psych negative neurological ROS  negative psych ROS   GI/Hepatic Neg liver ROS,,, S/p gastric bypass S/p lap band surgery    Endo/Other  diabetes, Poorly Controlled, Type 2, Oral Hypoglycemic Agents   On GLP-1a Obesity Na 133   Renal/GU negative Renal ROS     Musculoskeletal negative musculoskeletal ROS (+)    Abdominal  (+) + obese  Peds  Hematology negative hematology ROS (+)   Anesthesia Other Findings   Reproductive/Obstetrics                             Anesthesia Physical Anesthesia Plan  ASA: 3  Anesthesia Plan: General   Post-op Pain Management: Tylenol PO (pre-op)*   Induction: Intravenous  PONV Risk Score and Plan: 3 and Treatment may vary due to age or medical condition, Ondansetron, Dexamethasone and Midazolam  Airway Management Planned: Oral ETT  Additional Equipment: None  Intra-op Plan:   Post-operative Plan: Extubation in OR  Informed Consent: I have reviewed the patients History and Physical, chart, labs and discussed the procedure including the risks, benefits and alternatives for the proposed anesthesia with the patient or authorized representative who has indicated his/her understanding and acceptance.     Dental advisory given  Plan Discussed with: CRNA and Anesthesiologist  Anesthesia Plan Comments:         Anesthesia Quick Evaluation

## 2023-07-06 NOTE — Transfer of Care (Signed)
 Immediate Anesthesia Transfer of Care Note  Patient: Anibal Barker  Procedure(s) Performed: LAPAROSCOPIC CHOLECYSTECTOMY  Patient Location: PACU  Anesthesia Type:General  Level of Consciousness: awake, alert , and patient cooperative  Airway & Oxygen Therapy: Patient Spontanous Breathing and Patient connected to face mask oxygen  Post-op Assessment: Report given to RN and Post -op Vital signs reviewed and stable  Post vital signs: Reviewed and stable  Last Vitals:  Vitals Value Taken Time  BP 125/61 07/06/23 1101  Temp    Pulse 93 07/06/23 1103  Resp 20 07/06/23 1103  SpO2 100 % 07/06/23 1103  Vitals shown include unfiled device data.  Last Pain:  Vitals:   07/06/23 0801  TempSrc: Oral  PainSc: 0-No pain         Complications: No notable events documented.

## 2023-07-06 NOTE — H&P (Signed)
 Admitting Physician: Hyman Hopes Evelyn Aguinaldo  Service: General surgery  CC: abdominal pain  Subjective   HPI: Elizabeth Pennington is an 38 y.o. female who is here for cholecystectomy  Past Medical History:  Diagnosis Date   Diabetes (HCC)    Gestational diabetes    Hypertension    Thyroid condition    hypo and hyper    Past Surgical History:  Procedure Laterality Date   CESAREAN SECTION     x2   GASTRIC BYPASS      History reviewed. No pertinent family history.  Social:  reports that she has never smoked. She has never used smokeless tobacco. She reports that she does not drink alcohol and does not use drugs.  Allergies:  Allergies  Allergen Reactions   Other Nausea And Vomiting and Rash    Laughing gas    Medications: Current Outpatient Medications  Medication Instructions   CINNAMON PO Oral   doxycycline (VIBRA-TABS) 100 mg, Oral, 2 times daily   esomeprazole (NEXIUM) 40 mg, Oral, Daily before breakfast   GLYXAMBI 25-5 MG TABS 1 tablet, Oral, Daily   lisinopril (ZESTRIL) 20 mg, Oral, Daily   metFORMIN (GLUCOPHAGE-XR) 2,000 mg, Oral, Daily at bedtime   Misc Natural Products (CORTISOL PO) 1 tablet, Oral, Daily   Mounjaro 10 mg, Subcutaneous, Every Sun   Multiple Vitamins-Minerals (WOMENS MULTIVITAMIN) TABS 1 tablet, Oral, Daily   Omega-3 Fatty Acids (FISH OIL PO) 1 capsule, Oral, Daily   ondansetron (ZOFRAN-ODT) 4 mg, Oral, Every 8 hours PRN   rosuvastatin (CRESTOR) 5 mg, Oral, Daily    ROS - all of the below systems have been reviewed with the patient and positives are indicated with bold text General: chills, fever or night sweats Eyes: blurry vision or double vision ENT: epistaxis or sore throat Allergy/Immunology: itchy/watery eyes or nasal congestion Hematologic/Lymphatic: bleeding problems, blood clots or swollen lymph nodes Endocrine: temperature intolerance or unexpected weight changes Breast: new or changing breast lumps or nipple discharge Resp: cough,  shortness of breath, or wheezing CV: chest pain or dyspnea on exertion GI: as per HPI GU: dysuria, trouble voiding, or hematuria MSK: joint pain or joint stiffness Neuro: TIA or stroke symptoms Derm: pruritus and skin lesion changes Psych: anxiety and depression  Objective   PE Blood pressure 107/74, pulse 82, temperature 98.2 F (36.8 C), temperature source Oral, resp. rate 18, height 5\' 5"  (1.651 m), weight 104.3 kg, last menstrual period 06/18/2023, SpO2 100%, unknown if currently breastfeeding. Constitutional: NAD; conversant; no deformities Eyes: Moist conjunctiva; no lid lag; anicteric; PERRL Neck: Trachea midline; no thyromegaly Lungs: Normal respiratory effort; no tactile fremitus CV: RRR; no palpable thrills; no pitting edema GI: Abd soft, nontender, band port; no palpable hepatosplenomegaly MSK: Normal range of motion of extremities; no clubbing/cyanosis Psychiatric: Appropriate affect; alert and oriented x3 Lymphatic: No palpable cervical or axillary lymphadenopathy  Results for orders placed or performed during the hospital encounter of 07/06/23 (from the past 24 hours)  Glucose, capillary     Status: Abnormal   Collection Time: 07/06/23  8:24 AM  Result Value Ref Range   Glucose-Capillary 184 (H) 70 - 99 mg/dL  Pregnancy, urine POC     Status: None   Collection Time: 07/06/23  8:25 AM  Result Value Ref Range   Preg Test, Ur NEGATIVE NEGATIVE    Imaging Orders  No imaging studies ordered today     Assessment and Plan   Hx of laparoscopic adjustable gastric banding   [name] had a  lap band with Dr. Gaylyn Keas on 12/06/2006  [name] has a Lap-Band that she is not tolerating well due to multiple foregut complaints. She would like to consider having it removed, but would like to put this off until after the gallbladder is been dealt with.  [name] has gallstones and symptoms of biliary colic with severe right upper quadrant abdominal pain after eating  recently. I recommended laparoscopic cholecystectomy. We discussed the procedure itself as well as its risk, benefits, and alternatives. After full discussion and all questions answered the patient granted consent to proceed. Our surgery scheduler will reach out to the patient to schedule surgery.   [name], MD  [facility] Use [email or web address] to contact on call provider

## 2023-07-06 NOTE — Anesthesia Postprocedure Evaluation (Signed)
 Anesthesia Post Note  Patient: Cana Mignano  Procedure(s) Performed: LAPAROSCOPIC CHOLECYSTECTOMY     Patient location during evaluation: PACU Anesthesia Type: General Level of consciousness: awake and alert Pain management: pain level controlled Vital Signs Assessment: post-procedure vital signs reviewed and stable Respiratory status: spontaneous breathing, nonlabored ventilation and respiratory function stable Cardiovascular status: stable and blood pressure returned to baseline Anesthetic complications: no  No notable events documented.  Last Vitals:  Vitals:   07/06/23 1138 07/06/23 1145  BP:  105/67  Pulse: 85 81  Resp: 15 18  Temp:    SpO2: 95% 95%    Last Pain:  Vitals:   07/06/23 1145  TempSrc:   PainSc: Asleep                 Juventino Oppenheim

## 2023-07-06 NOTE — Discharge Instructions (Signed)
 CHOLECYSTECTOMY POST OPERATIVE INSTRUCTIONS  Thinking Clearly  The anesthesia may cause you to feel different for 1 or 2 days. Do not drive, drink alcohol, or make any big decisions for at least 2 days.  Nutrition When you wake up, you will be able to drink small amounts of liquid. If you do not feel sick, you can slowly advance your diet to regular foods. Continue to drink lots of fluids, usually about 8 to 10 glasses per day. Eat a high-fiber diet so you don't strain during bowel movements. High-Fiber Foods Foods high in fiber include beans, bran cereals and whole-grain breads, peas, dried fruit (figs, apricots, and dates), raspberries, blackberries, strawberries, sweet corn, broccoli, baked potatoes with skin, plums, pears, apples, greens, and nuts. Activity Slowly increase your activity. Be sure to get up and walk every hour or so to prevent blood clots. No heavy lifting or strenuous activity for 4 weeks following surgery to prevent hernias at your incision sites It is normal to feel tired. You may need more sleep than usual.  Get your rest but make sure to get up and move around frequently to prevent blood clots and pneumonia.  Work and Return to [employer] can go back to work when you feel well enough. Discuss the timing with your surgeon. You can usually go back to school or work 1 week after an operation. If your work requires heavy lifting or strenuous activity you need to be placed on light duty for 4 weeks following surgery. You can return to gym class, sports or other physical activities 4 weeks after surgery.  Wound Care Always wash your hands before and after touching near your incision site. Do not soak in a bathtub until cleared at your follow up appointment. You may take a shower 24 hours after surgery. A small amount of drainage from the incision is normal. If the drainage is thick and yellow or the site is red, you may have an infection, so call your surgeon. If you  have a drain in one of your incisions, it will be taken out in office when the drainage stops. Steri-Strips will fall off in 7 to 10 days or they will be removed during your first office visit. If you have dermabond glue covering over the incision, allow the glue to flake off on its own. Avoid wearing tight or rough clothing. It may rub your incisions and make it harder for them to heal. Protect the new skin, especially from the sun. The sun can burn and cause darker scarring. Your scar will heal in about 4 to 6 weeks and will become softer and continue to fade over the next year.  The cosmetic appearance of the incisions will improve over the course of the first year after surgery. Sensation around your incision will return in a few weeks or months.  Bowel Movements After intestinal surgery, you may have loose watery stools for several days. If watery diarrhea lasts longer than 3 days, contact your surgeon. Pain medication (narcotics) can cause constipation. Increase the fiber in your diet with high-fiber foods if you are constipated. You can take an over the counter stool softener like Colace to avoid constipation.  Additional over the counter medications can also be used if Colace isn't sufficient (for example, Milk of Magnesia or Miralax).  Pain The amount of pain is different for each person. Some people need only 1 to 3 doses of pain control medication, while others need more. Take alternating doses of tylenol  and ibuprofen around the clock for the first five days following surgery.  This will provide a baseline of pain control and help with inflammation.  Take the narcotic pain medication in addition if needed for severe pain.  Contact Your Surgeon at [phone], if you have: Pain in your right upper abdomen like a gallbladder attack. Pain that will not go away Pain that gets worse A fever of more than 101F (38.3C) Repeated vomiting Swelling, redness, bleeding, or bad-smelling  drainage from your wound site Strong abdominal pain No bowel movement or unable to pass gas for 3 days Watery diarrhea lasting longer than 3 days  Pain Control The goal of pain control is to minimize pain, keep you moving and help you heal. Your surgical team will work with you on your pain plan. Most often a combination of therapies and medications are used to control your pain. You may also be given medication (local anesthetic) at the surgical site. This may help control your pain for several days. Extreme pain puts extra stress on your body at a time when your body needs to focus on healing. Do not wait until your pain has reached a level "10" or is unbearable before telling your doctor or nurse. It is much easier to control pain before it becomes severe. Following a laparoscopic procedure, pain is sometimes felt in the shoulder. This is due to the gas inserted into your abdomen during the procedure. Moving and walking helps to decrease the gas and the right shoulder pain.  Use the guide below for ways to manage your post-operative pain. Learn more by going to facs.org/safepaincontrol.  How Intense Is My Pain Common Therapies to Feel Better       I hardly notice my pain, and it does not interfere with my activities.  I notice my pain and it distracts me, but I can still do activities (sitting up, walking, standing).  Non-Medication Therapies  Ice (in a bag, applied over clothing at the surgical site), elevation, rest, meditation, massage, distraction (music, TV, play) walking and mild exercise Splinting the abdomen with pillows +  Non-Opioid Medications Acetaminophen (Tylenol) Non-steroidal anti-inflammatory drugs (NSAIDS) Aspirin, Ibuprofen (Motrin, Advil) Naproxen (Aleve) Take these as needed, when you feel pain. Both acetaminophen and NSAIDs help to decrease pain and swelling (inflammation).      My pain is hard to ignore and is more noticeable even when I rest.  My  pain interferes with my usual activities.  Non-Medication Therapies  +  Non-Opioid medications  Take on a regular schedule (around-the-clock) instead of as needed. (For example, Tylenol every 6 hours at 9:00 am, 3:00 pm, 9:00 pm, 3:00 am and Motrin every 6 hours at 12:00 am, 6:00 am, 12:00 pm, 6:00 pm)         I am focused on my pain, and I am not doing my daily activities.  I am groaning in pain, and I cannot sleep. I am unable to do anything.  My pain is as bad as it could be, and nothing else matters.  Non-Medication Therapies  +  Around-the-Clock Non-Opioid Medications  +  Short-acting opioids  Opioids should be used with other medications to manage severe pain. Opioids block pain and give a feeling of euphoria (feel high). Addiction, a serious side effect of opioids, is rare with short-term (a few days) use.  Examples of short-acting opioids include: Tramadol (Ultram), Hydrocodone (Norco, Vicodin), Hydromorphone (Dilaudid), Oxycodone (Oxycontin)     The above directions have been adapted from  the [employer] of [employer] Surgical Patient Education Program.  Please refer to the ACS website if needed: FreakyMates.de.ashx.   Ivar Drape, MD [facility] [address], [address], [place], [place]  [address] ?  [address] [address], [place], [place]   [address] [phone] ? [phone] ? FAX [phone] Web site: [email or web address]

## 2023-07-07 ENCOUNTER — Encounter (HOSPITAL_COMMUNITY): Payer: Self-pay | Admitting: Surgery

## 2023-07-07 LAB — SURGICAL PATHOLOGY

## 2023-10-27 ENCOUNTER — Encounter: Payer: Self-pay | Admitting: Podiatry

## 2023-10-27 ENCOUNTER — Ambulatory Visit (INDEPENDENT_AMBULATORY_CARE_PROVIDER_SITE_OTHER): Admitting: Podiatry

## 2023-10-27 DIAGNOSIS — E1140 Type 2 diabetes mellitus with diabetic neuropathy, unspecified: Secondary | ICD-10-CM

## 2023-10-27 DIAGNOSIS — E1149 Type 2 diabetes mellitus with other diabetic neurological complication: Secondary | ICD-10-CM

## 2023-10-27 DIAGNOSIS — S90112A Contusion of left great toe without damage to nail, initial encounter: Secondary | ICD-10-CM

## 2023-10-27 NOTE — Progress Notes (Signed)
 Subjective:   Patient ID: Elizabeth Pennington, female   DOB: 38 y.o.   MRN: [id number]   HPI Patient has history of ulceration of the right fifth toe and is concerned because she over worked her left great toe and has developed some discoloration plantarly with a black spot and is concerned   ROS      Objective:  Physical Exam  Neurovascular status unchanged sugar is running in a relatively good range and patient continues to work on this diligently with a small black area plantar aspect left hallux measuring about 5 x 5 mm and not on the right with good healing of the right big toe     Assessment:  Contusion of the left big toe with trauma along with the right big toe healed from previous ulceration     Plan:  H&P reviewed and I discussed at great length I did encourage cushioning daily inspections good care for her diabetes but I do not see a break in skin or ulcerations I am satisfied with where this is at.  Patient to be seen back to recheck
